# Patient Record
Sex: Female | Born: 1940
Health system: Southern US, Community
[De-identification: ages and names within clinical notes are randomized; demographics above are authoritative.]

## PROBLEM LIST (undated history)

## (undated) DIAGNOSIS — M109 Gout, unspecified: Secondary | ICD-10-CM

## (undated) DIAGNOSIS — D649 Anemia, unspecified: Secondary | ICD-10-CM

## (undated) DIAGNOSIS — Z8679 Personal history of other diseases of the circulatory system: Secondary | ICD-10-CM

## (undated) DIAGNOSIS — F329 Major depressive disorder, single episode, unspecified: Secondary | ICD-10-CM

## (undated) DIAGNOSIS — K449 Diaphragmatic hernia without obstruction or gangrene: Secondary | ICD-10-CM

## (undated) DIAGNOSIS — E039 Hypothyroidism, unspecified: Secondary | ICD-10-CM

## (undated) DIAGNOSIS — I10 Essential (primary) hypertension: Secondary | ICD-10-CM

## (undated) DIAGNOSIS — G971 Other reaction to spinal and lumbar puncture: Secondary | ICD-10-CM

## (undated) DIAGNOSIS — M1990 Unspecified osteoarthritis, unspecified site: Secondary | ICD-10-CM

## (undated) DIAGNOSIS — F419 Anxiety disorder, unspecified: Secondary | ICD-10-CM

## (undated) DIAGNOSIS — M47812 Spondylosis without myelopathy or radiculopathy, cervical region: Secondary | ICD-10-CM

## (undated) DIAGNOSIS — C73 Malignant neoplasm of thyroid gland: Secondary | ICD-10-CM

## (undated) DIAGNOSIS — R011 Cardiac murmur, unspecified: Secondary | ICD-10-CM

## (undated) HISTORY — PX: DILATION AND CURETTAGE OF UTERUS: SHX78

## (undated) HISTORY — DX: Anemia, unspecified: D64.9

## (undated) HISTORY — DX: Essential (primary) hypertension: I10

## (undated) HISTORY — PX: JOINT REPLACEMENT: SHX530

## (undated) HISTORY — DX: Major depressive disorder, single episode, unspecified: F32.9

## (undated) HISTORY — PX: TONSILLECTOMY: SUR1361

## (undated) HISTORY — DX: Spondylosis without myelopathy or radiculopathy, cervical region: M47.812

## (undated) HISTORY — DX: Diaphragmatic hernia without obstruction or gangrene: K44.9

## (undated) HISTORY — DX: Personal history of other diseases of the circulatory system: Z86.79

## (undated) HISTORY — PX: ABDOMINAL HYSTERECTOMY: SHX81

---
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
## 2003-02-21 ENCOUNTER — Ambulatory Visit (HOSPITAL_COMMUNITY): Admission: RE | Admit: 2003-02-21 | Discharge: 2003-02-21 | Payer: Self-pay | Admitting: Cardiology

## 2003-03-08 ENCOUNTER — Ambulatory Visit (HOSPITAL_COMMUNITY): Admission: RE | Admit: 2003-03-08 | Discharge: 2003-03-08 | Payer: Self-pay | Admitting: Cardiology

## 2003-03-08 HISTORY — PX: CARDIAC CATHETERIZATION: SHX172

## 2007-01-27 ENCOUNTER — Ambulatory Visit (HOSPITAL_COMMUNITY): Admission: RE | Admit: 2007-01-27 | Discharge: 2007-01-27 | Payer: Self-pay | Admitting: Cardiology

## 2007-02-07 ENCOUNTER — Ambulatory Visit (HOSPITAL_COMMUNITY): Admission: RE | Admit: 2007-02-07 | Discharge: 2007-02-07 | Payer: Self-pay | Admitting: Cardiology

## 2007-02-24 ENCOUNTER — Other Ambulatory Visit: Admission: RE | Admit: 2007-02-24 | Discharge: 2007-02-24 | Payer: Self-pay | Admitting: Interventional Radiology

## 2007-02-24 ENCOUNTER — Encounter: Admission: RE | Admit: 2007-02-24 | Discharge: 2007-02-24 | Payer: Self-pay | Admitting: Cardiology

## 2007-02-24 ENCOUNTER — Encounter (INDEPENDENT_AMBULATORY_CARE_PROVIDER_SITE_OTHER): Payer: Self-pay | Admitting: Interventional Radiology

## 2007-03-06 DIAGNOSIS — C73 Malignant neoplasm of thyroid gland: Secondary | ICD-10-CM

## 2007-03-06 HISTORY — PX: TOTAL THYROIDECTOMY: SHX2547

## 2007-03-06 HISTORY — DX: Malignant neoplasm of thyroid gland: C73

## 2007-03-25 ENCOUNTER — Ambulatory Visit (HOSPITAL_COMMUNITY): Admission: RE | Admit: 2007-03-25 | Discharge: 2007-03-26 | Payer: Self-pay | Admitting: General Surgery

## 2007-03-25 ENCOUNTER — Encounter (HOSPITAL_BASED_OUTPATIENT_CLINIC_OR_DEPARTMENT_OTHER): Payer: Self-pay | Admitting: General Surgery

## 2007-06-10 ENCOUNTER — Encounter: Admission: RE | Admit: 2007-06-10 | Discharge: 2007-06-10 | Payer: Self-pay | Admitting: Endocrinology

## 2007-06-17 ENCOUNTER — Encounter: Admission: RE | Admit: 2007-06-17 | Discharge: 2007-06-17 | Payer: Self-pay | Admitting: Endocrinology

## 2007-06-24 ENCOUNTER — Encounter: Admission: RE | Admit: 2007-06-24 | Discharge: 2007-06-24 | Payer: Self-pay | Admitting: Endocrinology

## 2007-11-14 ENCOUNTER — Encounter: Admission: RE | Admit: 2007-11-14 | Discharge: 2007-11-14 | Payer: Self-pay | Admitting: Cardiology

## 2007-11-16 ENCOUNTER — Ambulatory Visit: Payer: Self-pay | Admitting: Pulmonary Disease

## 2007-11-16 DIAGNOSIS — K449 Diaphragmatic hernia without obstruction or gangrene: Secondary | ICD-10-CM | POA: Insufficient documentation

## 2007-11-16 DIAGNOSIS — I059 Rheumatic mitral valve disease, unspecified: Secondary | ICD-10-CM | POA: Insufficient documentation

## 2007-11-16 DIAGNOSIS — Z85850 Personal history of malignant neoplasm of thyroid: Secondary | ICD-10-CM

## 2007-11-16 DIAGNOSIS — R05 Cough: Secondary | ICD-10-CM

## 2009-01-21 ENCOUNTER — Encounter (HOSPITAL_COMMUNITY): Admission: RE | Admit: 2009-01-21 | Discharge: 2009-04-17 | Payer: Self-pay | Admitting: Endocrinology

## 2010-01-26 ENCOUNTER — Encounter: Payer: Self-pay | Admitting: Cardiology

## 2010-02-05 ENCOUNTER — Other Ambulatory Visit (HOSPITAL_COMMUNITY): Payer: Self-pay | Admitting: Endocrinology

## 2010-02-05 DIAGNOSIS — C73 Malignant neoplasm of thyroid gland: Secondary | ICD-10-CM

## 2010-03-10 ENCOUNTER — Ambulatory Visit (HOSPITAL_COMMUNITY)
Admission: RE | Admit: 2010-03-10 | Discharge: 2010-03-10 | Disposition: A | Discharge status: Home / Self Care | Payer: Medicare Other | Source: Ambulatory Visit | Attending: Endocrinology | Admitting: Endocrinology

## 2010-03-10 DIAGNOSIS — C73 Malignant neoplasm of thyroid gland: Secondary | ICD-10-CM | POA: Insufficient documentation

## 2010-03-11 ENCOUNTER — Ambulatory Visit (HOSPITAL_COMMUNITY)
Admission: RE | Admit: 2010-03-11 | Discharge: 2010-03-11 | Disposition: A | Discharge status: Home / Self Care | Payer: Medicare Other | Source: Ambulatory Visit | Attending: Endocrinology | Admitting: Endocrinology

## 2010-03-12 ENCOUNTER — Ambulatory Visit (HOSPITAL_COMMUNITY)
Admission: RE | Admit: 2010-03-12 | Discharge: 2010-03-12 | Disposition: A | Discharge status: Home / Self Care | Payer: Medicare Other | Source: Ambulatory Visit | Attending: Endocrinology | Admitting: Endocrinology

## 2010-03-14 ENCOUNTER — Ambulatory Visit (HOSPITAL_COMMUNITY)
Admission: RE | Admit: 2010-03-14 | Discharge: 2010-03-14 | Disposition: A | Discharge status: Home / Self Care | Payer: Medicare Other | Source: Ambulatory Visit | Attending: Endocrinology | Admitting: Endocrinology

## 2010-03-14 DIAGNOSIS — Z85850 Personal history of malignant neoplasm of thyroid: Secondary | ICD-10-CM | POA: Insufficient documentation

## 2010-03-14 DIAGNOSIS — E0789 Other specified disorders of thyroid: Secondary | ICD-10-CM | POA: Insufficient documentation

## 2010-03-14 MED ORDER — SODIUM IODIDE I 131 CAPSULE
4.0000 | Freq: Once | INTRAVENOUS | Status: AC | PRN
  Administered 2010-03-13: 4 via ORAL

## 2010-03-14 MED ORDER — SODIUM IODIDE I 131 CAPSULE
4.0000 | Freq: Once | INTRAVENOUS | Status: AC | PRN
Start: 2010-03-14 — End: 2010-03-14
  Administered 2010-03-14: 4 via ORAL

## 2010-05-20 NOTE — Op Note (Signed)
NAME:  Joanne Tran, COUSE NO.:  [id number]   MEDICAL RECORD NO.:  [id number]          PATIENT TYPE:  OIB   LOCATION:  2550                         FACILITY:  [facility]   PHYSICIAN:  Leonie Man, M.D.   DATE OF BIRTH:  1940-02-01   DATE OF PROCEDURE:  03/25/2007  DATE OF DISCHARGE:                               OPERATIVE REPORT   PREOPERATIVE DIAGNOSIS:  Papillary carcinoma of the thyroid.   POSTOPERATIVE DIAGNOSIS:  Papillary carcinoma of the thyroid.   OPERATION/PROCEDURE:  Total thyroidectomy.   SURGEON:  Leonie Man, M.D.   ASSISTANT:  Alfonse Ras, M.D.   ANESTHESIA:  General.   INDICATIONS:  Note the patient is a 70 year old patient with a large  thyroid nodule which on fine-needle aspiration biopsy shows a papillary  carcinoma.  The patient comes to the operating room now after risks and  potential benefits of surgery have been fully discussed including the  risk of recurrent laryngeal nerve injury as well as the risk of  permanent hypoparathyroidism were fully discussed.  She understands  these risks and gives her consent to surgery.   PROCEDURE:  The patient is positioned supinely and following induction  of satisfactory general anesthesia, the neck is prepped and draped to be  included in a sterile operative field.  Positive identification of the  patient as Joanne Tran was carried out and the procedure be done  total thyroidectomy.   A transverse collar incision extending to the anterior border of the  sternocleidomastoid on both sides of the neck was carried out  approximately two fingerbreadths above the sternal notch, deepened  through skin and subcutaneous tissues.  Then it was carried  through the  platysma. A superior muocutaneous flap is raised up to the thyroid  cartilage and inferior flap carried down to the sternal notch.  Midline  strap muscles are opened up in the midline and dissection was carried  over towards the right side  dissecting a very large cystic thyroid mass  down into the lateral portion of the right neck.  The thyroid is  elevated and dissection carried up towards the superior pole where the  superior pole vessels are identified and secured with ties of 2-0 silk  and then transected.  The  right thyroid lobe was then dissected free  from the surrounding tissues, being careful to avoid the areas of the  parathyroid glands and the recurrent laryngeal nerve.  The thyroid was  then dissected free, carried over the midline.   I then went to the other side of the table and elevated the strap  muscles on the left and carried them laterally.  We identified the left  thyroid lobe which was quite a bit smaller. Dissection carried up to the  superior pole of this gland and this was secured with 2-0 silk ties and  transected at the upper pole.  Thyroid was then dissected free from the  surrounding tissues. Definite identification of the left upper pole  parathyroid gland and recurrent laryngeal nerves were made.  Dissection  was carried out to remove the entire thyroid from off  of the trachea and  this was forwarded for pathologic evaluation.  There are no palpable  lymph nodes within the neck.  All areas of dissection were then checked  for hemostasis and hemostasis was obtained with electrocautery.  Sponge  and instrument counts were verified.  I placed surgicel pads in either  side of the neck for additional hemostasis.  The midline strap muscles  were then closed with interrupted 3-0 Vicryl sutures.  The platysma  muscle closed with interrupted 3-0 Vicryl and skin closed with running 5-  0 Monocryl suture, reinforced with Steri-Strips.  Sterile dressings  applied.  The anesthetic was reversed and the patient removed from the  operating room to the recovery room in stable condition.  She tolerated  the procedure well.      Leonie Man, M.D.  Electronically Signed     PB/MEDQ  D:  03/25/2007  T:   03/25/2007  Job:  [id number]   cc:   Osvaldo Shipper. Spruill, M.D.

## 2010-05-23 NOTE — Cardiovascular Report (Signed)
NAME:  Joanne Tran, Joanne Tran                        ACCOUNT NO.:  [id number]   MEDICAL RECORD NO.:  [id number]                   PATIENT TYPE:  OIB   LOCATION:  2899                                 FACILITY:  [facility]   PHYSICIAN:  Eduardo Osier. Sharyn Lull, M.D.              DATE OF BIRTH:  05-06-1940   DATE OF PROCEDURE:  03/08/2003  DATE OF DISCHARGE:  03/08/2003                              CARDIAC CATHETERIZATION   PROCEDURES PERFORMED:  1. Left cardiac catheterization.  2. Selective left and right coronary angiography.  3. Left ventriculography via right groin using Judkins technique.   CARDIOLOGISTEduardo Osier Sharyn Lull, M.D.   INDICATIONS FOR THE PROCEDURE:  Ms. Drenda is a 70 year old white female  with a past medical history significant for hypertension, heart murmur,  depression, and positive family history of heart disease.  The patient has  complaints of retrosternal squeezing chest pain associated with feeling weak  and tired since Christmas off and on; and lately, for the last two weeks,  recurrent chest pain associated with feeling weak and tired.  The patient  also gives a history of exertional dyspnea.  Denies PND, orthopnea and leg  swelling.  Denies palpitations, lightheadedness and syncope.   The patient had a Persantine Cardiolite on February 21, 2003, which showed a  focal area of ischemia in the mid anterior wall with an EF of 76%.  The  patient was referred for a left cath, and possible PTCA and stenting.   PAST MEDICAL HISTORY:  Past medical history is as above.   PAST SURGICAL HISTORY:  The patient had a hysterectomy 30+ years ago and  prior to the hysterectomy she had multiple D&Cs.  She had an appendectomy at  the age of 7.   ALLERGIES:  The patient is allergic to CODEINE.   MEDICATIONS:  Medications at home:  1. Tenormin 50 mg p.o. daily.  2. Hydrochlorothiazide 25 mg p.o. daily.  3. Prozac 20 mg p.o. daily.  4. Baby aspirin 81 mg p.o. daily.   SOCIAL HISTORY:   The patient is married and has one child.  No history of  smoking or alcohol abuse.  She works with mentally challenged adults.   FAMILY HISTORY:  Father died of an MI at the age of 72. Mother died of an MI  at the age of 96.  Uncles on father's side had coronary artery disease and  coronary artery bypass grafting.  One sister in good health.   PHYSICAL EXAMINATION:  GENERAL APPEARANCE:  On examination she is alert,  awake and oriented times three and in no acute distress.  VITAL SIGNS:  Blood pressure is 130/70 and pulse is 70 and reg.  HEENT:  Conjunctivae are pink.  NECK:  Neck supple.  No JVD.  No bruits.  LUNGS:  Lungs are clear to auscultation without rhonchi or rales.  CARDIOVASCULAR EXAMINATION:  S1 and S2 are normal.  There is no  S3 gallop or  murmur.  ABDOMEN:  Abdomen is soft.  Bowel sounds are present.  Nontender.  EXTREMITIES:  There is no clubbing, cyanosis or edema.   IMPRESSION:  1. New onset angina with recent exacerbation.  2. Positive Persantine Cardiolite.  3. Hypertension.  4. Depression.  5. Positive family history of coronary artery disease.   Discussed with the patient regarding left cath, possible PTCA and stenting;  and its risks, i.e. death, MI, stroke, need for emergency CABG, risks of  restenosis, local vascular complications, etc; and, she consented for PCI.   DESCRIPTION OF PROCEDURE:  After obtaining the informed consent the patient  was brought to the cath lab and was placed on the fluoroscopic table.  The  right groin was prepped and draped in the usual fashion.  Two percent  Xylocaine was used for local anesthesia in the right groin.  With the help  of a thin-walled needle a 6 French arterial sheath was placed.  The sheath  was aspirated and flushed.  Next, a 6 French left Judkins catheter was  advanced over the wire under fluoroscopic guidance up to the ascending  aorta.  The wire was pulled out, the catheter was aspirated and connected to  the  manifold.  Catheter was further advanced and engaged into the left  coronary ostium.  Multiple views of the left system were taken.  Next, the  catheter was disengaged, was pulled out over the wire and was replaced with  a 6 French right Judkins catheter, which was advanced over the wire under  fluoroscopic guidance up to the ascending aorta.  The wire was pulled out,  the catheter was aspirated and connected to the manifold.  Catheter was  further advanced and engaged into the right coronary ostium.  Multiple views  of the right system were taken.  Next, the catheter was disengaged, was  pulled out over the wire and was replaced with a 6 French pigtail catheter,  which was advanced over the wire under fluoroscopic guidance up to the  ascending aorta.  the wire was pulled out, the catheter was aspirated and  connected to the manifold.  The catheter was further advanced across the  aortic valve and into the LV.  LV pressures were recorded.  Next, left  ventriculography was done in the 30-degree RAO position.  Post angiographic  pressures were recorded from the LV and then pullback pressures were  recorded from the aorta.  There was no gradient across the aortic valve.   Next, the pigtail catheter was pulled over the wire, sheaths were aspirated  and flushed.   FINDINGS:  LV:  The LV showed good left ventricular systolic function with  an EF of 60-65%.   Left Main:  The left main was patent.   LAD:  The LAD was patent in the proximal and midportion, and distally had 20-  30% diffuse disease.  The diagonal-1 to the diagonal-3 were very small,  which were patent.   Left Circumflex:  The  left circumflex was patent.  The OM-1 was very small,  which arises close to the left main, which was patent.  The OM-2 was large,  which was patent.   RCA:  The RCA was patent.   The patient tolerated the procedure well.  There were no complications.  The patient was transferred to th recovery room  in stable condition.  Eduardo Osier. Sharyn Lull, M.D.    MNH/MEDQ  D:  03/08/2003  T:  03/09/2003  Job:  [id number]   cc:   Valetta Mole. Swords, M.D. [facility]   Catheterization Laboratory

## 2010-09-29 LAB — COMPREHENSIVE METABOLIC PANEL
AST: 26
Albumin: 3.7
Calcium: 8.9
Creatinine, Ser: 0.84
GFR calc Af Amer: >60

## 2010-09-29 LAB — APTT: aPTT: 29

## 2010-09-29 LAB — CBC
MCHC: 33.2
MCV: 84.6
Platelets: 382
RDW: 14.1
WBC: 13 — ABNORMAL HIGH

## 2010-09-29 LAB — PROTIME-INR: Prothrombin Time: 12.6

## 2010-09-29 LAB — CALCIUM
Calcium: 8.2 — ABNORMAL LOW
Calcium: 8.2 — ABNORMAL LOW

## 2010-09-29 LAB — DIFFERENTIAL
Eosinophils Relative: 1
Lymphocytes Relative: 25
Lymphs Abs: 3.2
Monocytes Absolute: 0.4
Neutro Abs: 9.2 — ABNORMAL HIGH

## 2012-12-21 ENCOUNTER — Other Ambulatory Visit: Payer: Self-pay | Admitting: Pulmonary Disease

## 2013-01-16 DIAGNOSIS — M1990 Unspecified osteoarthritis, unspecified site: Secondary | ICD-10-CM | POA: Diagnosis not present

## 2013-01-24 ENCOUNTER — Other Ambulatory Visit: Payer: Self-pay | Admitting: Pulmonary Disease

## 2013-02-09 DIAGNOSIS — E890 Postprocedural hypothyroidism: Secondary | ICD-10-CM | POA: Diagnosis not present

## 2013-02-16 DIAGNOSIS — E890 Postprocedural hypothyroidism: Secondary | ICD-10-CM | POA: Diagnosis not present

## 2013-02-16 DIAGNOSIS — C73 Malignant neoplasm of thyroid gland: Secondary | ICD-10-CM | POA: Diagnosis not present

## 2013-03-01 ENCOUNTER — Other Ambulatory Visit: Payer: Self-pay | Admitting: Pulmonary Disease

## 2013-04-06 ENCOUNTER — Other Ambulatory Visit: Payer: Self-pay | Admitting: Pulmonary Disease

## 2013-05-08 DIAGNOSIS — M1990 Unspecified osteoarthritis, unspecified site: Secondary | ICD-10-CM | POA: Diagnosis not present

## 2013-05-11 ENCOUNTER — Other Ambulatory Visit: Payer: Self-pay | Admitting: Pulmonary Disease

## 2013-09-18 DIAGNOSIS — F411 Generalized anxiety disorder: Secondary | ICD-10-CM | POA: Diagnosis not present

## 2013-10-16 DIAGNOSIS — Z1231 Encounter for screening mammogram for malignant neoplasm of breast: Secondary | ICD-10-CM | POA: Diagnosis not present

## 2013-10-16 DIAGNOSIS — Z01419 Encounter for gynecological examination (general) (routine) without abnormal findings: Secondary | ICD-10-CM | POA: Diagnosis not present

## 2013-10-16 DIAGNOSIS — N951 Menopausal and female climacteric states: Secondary | ICD-10-CM | POA: Diagnosis not present

## 2013-11-29 DIAGNOSIS — M170 Bilateral primary osteoarthritis of knee: Secondary | ICD-10-CM | POA: Diagnosis not present

## 2013-11-29 DIAGNOSIS — M1711 Unilateral primary osteoarthritis, right knee: Secondary | ICD-10-CM | POA: Diagnosis not present

## 2013-12-13 DIAGNOSIS — M1712 Unilateral primary osteoarthritis, left knee: Secondary | ICD-10-CM | POA: Diagnosis not present

## 2013-12-18 DIAGNOSIS — E669 Obesity, unspecified: Secondary | ICD-10-CM | POA: Diagnosis not present

## 2013-12-18 DIAGNOSIS — F419 Anxiety disorder, unspecified: Secondary | ICD-10-CM | POA: Diagnosis not present

## 2014-01-01 DIAGNOSIS — E785 Hyperlipidemia, unspecified: Secondary | ICD-10-CM | POA: Diagnosis not present

## 2014-01-01 DIAGNOSIS — I10 Essential (primary) hypertension: Secondary | ICD-10-CM | POA: Diagnosis not present

## 2014-02-15 DIAGNOSIS — C73 Malignant neoplasm of thyroid gland: Secondary | ICD-10-CM | POA: Diagnosis not present

## 2014-02-15 DIAGNOSIS — E890 Postprocedural hypothyroidism: Secondary | ICD-10-CM | POA: Diagnosis not present

## 2014-02-22 DIAGNOSIS — E890 Postprocedural hypothyroidism: Secondary | ICD-10-CM | POA: Diagnosis not present

## 2014-02-22 DIAGNOSIS — C73 Malignant neoplasm of thyroid gland: Secondary | ICD-10-CM | POA: Diagnosis not present

## 2014-03-07 DIAGNOSIS — E669 Obesity, unspecified: Secondary | ICD-10-CM | POA: Diagnosis not present

## 2014-03-07 DIAGNOSIS — F419 Anxiety disorder, unspecified: Secondary | ICD-10-CM | POA: Diagnosis not present

## 2014-05-24 DIAGNOSIS — E890 Postprocedural hypothyroidism: Secondary | ICD-10-CM | POA: Diagnosis not present

## 2014-05-24 DIAGNOSIS — C73 Malignant neoplasm of thyroid gland: Secondary | ICD-10-CM | POA: Diagnosis not present

## 2014-06-06 DIAGNOSIS — M179 Osteoarthritis of knee, unspecified: Secondary | ICD-10-CM | POA: Diagnosis not present

## 2014-08-07 DIAGNOSIS — E669 Obesity, unspecified: Secondary | ICD-10-CM | POA: Diagnosis not present

## 2014-08-07 DIAGNOSIS — F419 Anxiety disorder, unspecified: Secondary | ICD-10-CM | POA: Diagnosis not present

## 2014-10-04 DIAGNOSIS — M1712 Unilateral primary osteoarthritis, left knee: Secondary | ICD-10-CM | POA: Diagnosis not present

## 2014-10-25 DIAGNOSIS — Z1231 Encounter for screening mammogram for malignant neoplasm of breast: Secondary | ICD-10-CM | POA: Diagnosis not present

## 2014-10-25 DIAGNOSIS — Z6833 Body mass index (BMI) 33.0-33.9, adult: Secondary | ICD-10-CM | POA: Diagnosis not present

## 2014-10-25 DIAGNOSIS — Z01419 Encounter for gynecological examination (general) (routine) without abnormal findings: Secondary | ICD-10-CM | POA: Diagnosis not present

## 2014-12-12 DIAGNOSIS — M1711 Unilateral primary osteoarthritis, right knee: Secondary | ICD-10-CM | POA: Diagnosis not present

## 2014-12-17 DIAGNOSIS — E669 Obesity, unspecified: Secondary | ICD-10-CM | POA: Diagnosis not present

## 2014-12-17 DIAGNOSIS — F419 Anxiety disorder, unspecified: Secondary | ICD-10-CM | POA: Diagnosis not present

## 2014-12-18 ENCOUNTER — Encounter: Payer: Self-pay | Admitting: *Deleted

## 2014-12-21 ENCOUNTER — Ambulatory Visit (INDEPENDENT_AMBULATORY_CARE_PROVIDER_SITE_OTHER): Payer: Medicare Other | Admitting: Cardiology

## 2014-12-21 ENCOUNTER — Telehealth: Payer: Self-pay | Admitting: Cardiology

## 2014-12-21 ENCOUNTER — Encounter: Payer: Self-pay | Admitting: *Deleted

## 2014-12-21 ENCOUNTER — Encounter: Payer: Self-pay | Admitting: Cardiology

## 2014-12-21 VITALS — BP 122/72 | HR 63 | Ht 65.0 in | Wt 202.0 lb

## 2014-12-21 DIAGNOSIS — E7800 Pure hypercholesterolemia, unspecified: Secondary | ICD-10-CM | POA: Diagnosis not present

## 2014-12-21 DIAGNOSIS — E039 Hypothyroidism, unspecified: Secondary | ICD-10-CM | POA: Diagnosis not present

## 2014-12-21 DIAGNOSIS — I25709 Atherosclerosis of coronary artery bypass graft(s), unspecified, with unspecified angina pectoris: Secondary | ICD-10-CM

## 2014-12-21 DIAGNOSIS — I10 Essential (primary) hypertension: Secondary | ICD-10-CM | POA: Diagnosis not present

## 2014-12-21 DIAGNOSIS — R072 Precordial pain: Secondary | ICD-10-CM

## 2014-12-21 DIAGNOSIS — E876 Hypokalemia: Secondary | ICD-10-CM | POA: Diagnosis not present

## 2014-12-21 DIAGNOSIS — Z8679 Personal history of other diseases of the circulatory system: Secondary | ICD-10-CM

## 2014-12-21 NOTE — Progress Notes (Signed)
Cardiology Office Note  Date: 12/21/2014   ID: [name], DOB [birth year], MRN [id number]  PCP: Patricia Nettle, MD  Consulting Cardiologist: Rozann Lesches, MD   Chief Complaint  Patient presents with  . Chest Pain    History of Present Illness: Joanne Tran is a 74 y.o. female referred for cardiology consultation by Dr. Montez Morita with request for stress testing. I reviewed her history. She states that over the last week she has been experiencing a feeling of chest tightness and bilateral upper arm discomfort, usually before she goes to bed at nighttime. This lasts for several minutes. Otherwise, she has not had an exertional onset of symptoms, but states that she has dyspnea on exertion that has been present for several months. So far she has not had any recurrent symptoms this week.  Records indicate previous cardiac catheterization by Dr. Terrence Dupont back in March 2005 following an abnormal Persantine Cardiolite, which demonstrated only 20-30% LAD stenosis distally. She does not recall the symptoms that she was experiencing at that time.  I reviewed her recent ECG, outlined below. She does have lab work obtained by Dr. Montez Morita, results pending.  We discussed her medications, she has had no major changes overall. Blood pressure and heart rate look good today.  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  Past Medical History  Diagnosis Date  . Major depression ([place])   . History of thyroid cancer March 2009  . Cervical spondylosis   . Hiatal hernia   . History of mitral valve prolapse     Past Surgical History  Procedure Laterality Date  . Total thyroidectomy      Radioactive iodine therapy Dr. Chalmers Cater  . Cardiac catheterization  03/08/2003    Dr. Terrence Dupont  . Abdominal hysterectomy    . Dilation and curettage of uterus    . Appendectomy      age 32    Current Outpatient Prescriptions  Medication Sig Dispense Refill  . aspirin EC 81 MG  tablet Take 81 mg by mouth daily.    Marland Kitchen atenolol (TENORMIN) 50 MG tablet Take 50 mg by mouth daily.    Marland Kitchen FLUoxetine (PROZAC) 20 MG capsule Take 20 mg by mouth daily.    . hydrochlorothiazide (HYDRODIURIL) 25 MG tablet Take 25 mg by mouth daily.    Marland Kitchen levothyroxine (SYNTHROID, LEVOTHROID) 88 MCG tablet Take 88 mcg by mouth daily before breakfast.    . lisinopril (PRINIVIL,ZESTRIL) 40 MG tablet Take 40 mg by mouth daily.     No current facility-administered medications for this visit.   Allergies:  Codeine   Social History: The patient  reports that she has never smoked. She has never used smokeless tobacco. She reports that she does not drink alcohol.   Family History: The patient's family history includes Heart attack in her father and mother; Heart disease in her father, mother, and paternal uncle; Ovarian cancer in her sister.   ROS:  Please see the history of present illness. Otherwise, complete review of systems is positive for back pain.  All other systems are reviewed and negative.   Physical Exam: VS:  BP 122/72 mmHg  Pulse 63  Ht 5\' 5"  (1.651 m)  Wt 202 lb (91.627 kg)  BMI 33.61 kg/m2  SpO2 97%, BMI Body mass index is 33.61 kg/(m^2).  Wt Readings from Last 3 Encounters:  12/21/14 202 lb (91.627 kg)  11/16/07 217 lb (98.431 kg)    General: Overweight woman,  appears comfortable at rest. HEENT: Conjunctiva and lids normal, oropharynx clear. Neck: Supple, no elevated JVP or carotid bruits, no thyromegaly. Lungs: Clear to auscultation, nonlabored breathing at rest. Cardiac: Regular rate and rhythm, no S3 or significant systolic murmur, no pericardial rub. Abdomen: Soft, nontender, bowel sounds present, no guarding or rebound. Extremities: No pitting edema, distal pulses 2+. Skin: Warm and dry. Musculoskeletal: No kyphosis. Neuropsychiatric: Alert and oriented x3, affect grossly appropriate.  ECG: tracing from 12/17/2014 showed sinus bradycardia with early repolarization and  nonspecific T-wave changes.  Assessment and Plan:  1. Recent recurring precordial tightness with bilateral arm discomfort mainly in the evenings before bedtime. There has not been a definite exertional component but she does have dyspnea on exertion. So far she has not had recurring symptoms this week. Her history does include mild atherosclerosis within the LAD back in 2005, family history of premature CAD in her mother and father, elevated blood pressure, and history of mitral valve prolapse. ECG is nonspecific. We discussed options for follow-up evaluation including both noninvasive and invasive techniques. For now she was comfortable with stress testing and we will arrange a Lexiscan Cardiolite on medical therapy. I did ask her to seek medical attention if her symptoms escalate suddenly in the meanwhile.  2. History of hypertension, she is on atenolol, HCTZ and lisinopril. Blood pressure is normal today.  3. Hypothyroidism, on Synthroid. She just recently had lab work obtained, results pending.  Current medicines were reviewed with the patient today.   Orders Placed This Encounter  Procedures  . NM Myocar Multi W/Spect W/Wall Motion / EF  . Myocardial Perfusion Imaging    Disposition: Call with results.   Signed, Satira Sark, MD, [facility] 12/21/2014 2:43 PM    [place] [facility] [place], [place],  [address] Phone: [phone]; Fax: [phone]

## 2014-12-21 NOTE — Telephone Encounter (Signed)
Lexiscan CArdiolite on medications (soon appt) dx: precordial pain & CAD w/unspecified angina Scheduled Dec 26th arrival at 10:15am

## 2014-12-21 NOTE — Patient Instructions (Signed)
Your physician recommends that you continue on your current medications as directed. Please refer to the Current Medication list given to you today. Your physician has requested that you have a lexiscan myoview. For further information please visit [email or web address]. Please follow instruction sheet, as given. We will call you with your results. 

## 2014-12-25 NOTE — Telephone Encounter (Signed)
No precert required 

## 2014-12-31 ENCOUNTER — Encounter (HOSPITAL_COMMUNITY): Payer: PRIVATE HEALTH INSURANCE

## 2015-01-02 ENCOUNTER — Encounter (HOSPITAL_COMMUNITY): Payer: PRIVATE HEALTH INSURANCE

## 2015-01-04 ENCOUNTER — Encounter (HOSPITAL_COMMUNITY)
Admission: RE | Admit: 2015-01-04 | Discharge: 2015-01-04 | Disposition: A | Discharge status: Home / Self Care | Payer: Medicare Other | Source: Ambulatory Visit | Attending: Cardiology | Admitting: Cardiology

## 2015-01-04 ENCOUNTER — Encounter (HOSPITAL_COMMUNITY): Payer: Self-pay

## 2015-01-04 ENCOUNTER — Telehealth: Payer: Self-pay | Admitting: *Deleted

## 2015-01-04 ENCOUNTER — Inpatient Hospital Stay (HOSPITAL_COMMUNITY): Admission: RE | Admit: 2015-01-04 | Payer: PRIVATE HEALTH INSURANCE | Source: Ambulatory Visit

## 2015-01-04 DIAGNOSIS — R072 Precordial pain: Secondary | ICD-10-CM

## 2015-01-04 DIAGNOSIS — I25709 Atherosclerosis of coronary artery bypass graft(s), unspecified, with unspecified angina pectoris: Secondary | ICD-10-CM | POA: Insufficient documentation

## 2015-01-04 LAB — NM MYOCAR MULTI W/SPECT W/WALL MOTION / EF
CHL CUP NUCLEAR SRS: 4
CHL CUP NUCLEAR SSS: 10
CSEPPHR: 76 {beats}/min
LHR: 0
LV dias vol: 60 mL
LV sys vol: 18 mL
Rest HR: 53 {beats}/min
SDS: 6
TID: 1.2

## 2015-01-04 MED ORDER — REGADENOSON 0.4 MG/5ML IV SOLN
INTRAVENOUS | Status: AC
  Administered 2015-01-04: 0.4 mg via INTRAVENOUS
  Filled 2015-01-04: qty 5

## 2015-01-04 MED ORDER — SODIUM CHLORIDE 0.9 % IJ SOLN
INTRAMUSCULAR | Status: AC
  Administered 2015-01-04: 10 mL via INTRAVENOUS
  Filled 2015-01-04: qty 3

## 2015-01-04 MED ORDER — TECHNETIUM TC 99M SESTAMIBI - CARDIOLITE
10.0000 | Freq: Once | INTRAVENOUS | Status: AC | PRN
  Administered 2015-01-04: 10 via INTRAVENOUS

## 2015-01-04 MED ORDER — TECHNETIUM TC 99M SESTAMIBI GENERIC - CARDIOLITE
30.0000 | Freq: Once | INTRAVENOUS | Status: AC | PRN
  Administered 2015-01-04: 29 via INTRAVENOUS

## 2015-01-04 NOTE — Telephone Encounter (Signed)
Pt aware, routed to pcp 

## 2015-01-04 NOTE — Telephone Encounter (Signed)
-----   Message from Satira Sark, MD sent at 01/04/2015  3:50 PM EST ----- Reviewed report. Please let her know that the stress test was normal, overall low risk. No further cardiac testing planned unless her symptoms worsen. Keep follow-up with Dr. Montez Morita.

## 2015-02-18 DIAGNOSIS — C73 Malignant neoplasm of thyroid gland: Secondary | ICD-10-CM | POA: Diagnosis not present

## 2015-02-18 DIAGNOSIS — E890 Postprocedural hypothyroidism: Secondary | ICD-10-CM | POA: Diagnosis not present

## 2015-02-25 DIAGNOSIS — E890 Postprocedural hypothyroidism: Secondary | ICD-10-CM | POA: Diagnosis not present

## 2015-02-25 DIAGNOSIS — C73 Malignant neoplasm of thyroid gland: Secondary | ICD-10-CM | POA: Diagnosis not present

## 2015-03-18 DIAGNOSIS — E669 Obesity, unspecified: Secondary | ICD-10-CM | POA: Diagnosis not present

## 2015-03-18 DIAGNOSIS — N289 Disorder of kidney and ureter, unspecified: Secondary | ICD-10-CM | POA: Diagnosis not present

## 2015-04-01 DIAGNOSIS — N289 Disorder of kidney and ureter, unspecified: Secondary | ICD-10-CM | POA: Diagnosis not present

## 2015-04-01 DIAGNOSIS — I10 Essential (primary) hypertension: Secondary | ICD-10-CM | POA: Diagnosis not present

## 2015-04-01 DIAGNOSIS — E785 Hyperlipidemia, unspecified: Secondary | ICD-10-CM | POA: Diagnosis not present

## 2015-04-10 DIAGNOSIS — M1712 Unilateral primary osteoarthritis, left knee: Secondary | ICD-10-CM | POA: Diagnosis not present

## 2015-05-29 DIAGNOSIS — M1712 Unilateral primary osteoarthritis, left knee: Secondary | ICD-10-CM | POA: Diagnosis not present

## 2015-05-30 ENCOUNTER — Encounter (HOSPITAL_COMMUNITY)
Admission: RE | Admit: 2015-05-30 | Discharge: 2015-05-30 | Disposition: A | Discharge status: Home / Self Care | Payer: Medicare Other | Source: Ambulatory Visit | Attending: Orthopaedic Surgery | Admitting: Orthopaedic Surgery

## 2015-05-30 ENCOUNTER — Encounter (HOSPITAL_COMMUNITY)
Admission: RE | Admit: 2015-05-30 | Discharge: 2015-05-30 | Disposition: A | Discharge status: Home / Self Care | Payer: Medicare Other | Source: Ambulatory Visit | Attending: Orthopedic Surgery | Admitting: Orthopedic Surgery

## 2015-05-30 ENCOUNTER — Other Ambulatory Visit (HOSPITAL_COMMUNITY): Payer: Self-pay | Admitting: *Deleted

## 2015-05-30 ENCOUNTER — Encounter (HOSPITAL_COMMUNITY): Payer: Self-pay

## 2015-05-30 DIAGNOSIS — I10 Essential (primary) hypertension: Secondary | ICD-10-CM | POA: Diagnosis not present

## 2015-05-30 DIAGNOSIS — M1712 Unilateral primary osteoarthritis, left knee: Secondary | ICD-10-CM | POA: Insufficient documentation

## 2015-05-30 DIAGNOSIS — Z79899 Other long term (current) drug therapy: Secondary | ICD-10-CM | POA: Diagnosis not present

## 2015-05-30 DIAGNOSIS — Z01812 Encounter for preprocedural laboratory examination: Secondary | ICD-10-CM | POA: Diagnosis not present

## 2015-05-30 DIAGNOSIS — Z85850 Personal history of malignant neoplasm of thyroid: Secondary | ICD-10-CM | POA: Insufficient documentation

## 2015-05-30 DIAGNOSIS — I341 Nonrheumatic mitral (valve) prolapse: Secondary | ICD-10-CM | POA: Diagnosis not present

## 2015-05-30 DIAGNOSIS — Z7982 Long term (current) use of aspirin: Secondary | ICD-10-CM | POA: Diagnosis not present

## 2015-05-30 DIAGNOSIS — Z0183 Encounter for blood typing: Secondary | ICD-10-CM | POA: Diagnosis not present

## 2015-05-30 DIAGNOSIS — Z01818 Encounter for other preprocedural examination: Secondary | ICD-10-CM | POA: Diagnosis not present

## 2015-05-30 HISTORY — DX: Anxiety disorder, unspecified: F41.9

## 2015-05-30 HISTORY — DX: Other reaction to spinal and lumbar puncture: G97.1

## 2015-05-30 HISTORY — DX: Cardiac murmur, unspecified: R01.1

## 2015-05-30 LAB — COMPREHENSIVE METABOLIC PANEL
ALBUMIN: 3.3 g/dL — AB (ref 3.5–5.0)
ALK PHOS: 69 U/L (ref 38–126)
ALT: 14 U/L (ref 14–54)
AST: 21 U/L (ref 15–41)
Anion gap: 8 (ref 5–15)
BILIRUBIN TOTAL: 0.6 mg/dL (ref 0.3–1.2)
BUN: 16 mg/dL (ref 6–20)
CO2: 23 mmol/L (ref 22–32)
CREATININE: 1.02 mg/dL — AB (ref 0.44–1.00)
Calcium: 9 mg/dL (ref 8.9–10.3)
Chloride: 109 mmol/L (ref 101–111)
GFR calc Af Amer: >60 mL/min (ref >60)
GFR, EST NON AFRICAN AMERICAN: 54 mL/min — AB (ref >60)
GLUCOSE: 105 mg/dL — AB (ref 65–99)
POTASSIUM: 3.8 mmol/L (ref 3.5–5.1)
Sodium: 140 mmol/L (ref 135–145)
TOTAL PROTEIN: 6.5 g/dL (ref 6.5–8.1)

## 2015-05-30 LAB — URINALYSIS, ROUTINE W REFLEX MICROSCOPIC
Bilirubin Urine: NEGATIVE
GLUCOSE, UA: NEGATIVE mg/dL
Ketones, ur: NEGATIVE mg/dL
Nitrite: NEGATIVE
Protein, ur: NEGATIVE mg/dL
SPECIFIC GRAVITY, URINE: 1.026 (ref 1.005–1.030)
pH: 6 (ref 5.0–8.0)

## 2015-05-30 LAB — APTT: APTT: 27 s (ref 24–37)

## 2015-05-30 LAB — SURGICAL PCR SCREEN
MRSA, PCR: NEGATIVE
Staphylococcus aureus: POSITIVE — AB

## 2015-05-30 LAB — CBC
HEMATOCRIT: 42.6 % (ref 36.0–46.0)
HEMOGLOBIN: 13.3 g/dL (ref 12.0–15.0)
MCH: 27.5 pg (ref 26.0–34.0)
MCHC: 31.2 g/dL (ref 30.0–36.0)
MCV: 88 fL (ref 78.0–100.0)
Platelets: 281 10*3/uL (ref 150–400)
RBC: 4.84 MIL/uL (ref 3.87–5.11)
RDW: 13.6 % (ref 11.5–15.5)
WBC: 9.7 10*3/uL (ref 4.0–10.5)

## 2015-05-30 LAB — TYPE AND SCREEN
ABO/RH(D): O POS
ANTIBODY SCREEN: NEGATIVE

## 2015-05-30 LAB — PROTIME-INR
INR: 1.01 (ref 0.00–1.49)
Prothrombin Time: 13.5 seconds (ref 11.6–15.2)

## 2015-05-30 LAB — ABO/RH: ABO/RH(D): O POS

## 2015-05-30 LAB — URINE MICROSCOPIC-ADD ON

## 2015-05-30 NOTE — Progress Notes (Signed)
Cardiologist Dr Domenic Polite with [facility].  Last seen in December 2016   States had stress test due to some chest pain but it was normal.  Has not had any chest pain recently. States had echo cardiogram more than 5 years ago with Dr Domenic Polite but I am not able to find in [facility] Heart Cath report from 2005 in EPIC media EKG in EPIC  PCP Dr Theresia Majors

## 2015-05-30 NOTE — Pre-Procedure Instructions (Signed)
    Joanne Tran  05/30/2015      [facility] - [place], [address] [place] [address] Phone: [phone] Fax: [phone]    Your procedure is scheduled on Tuesday June 6th  Report to [facility] Admitting at 5:30 am  Call this number if you have problems the morning of surgery: [phone]  If questions prior to surgery date you may call [phone] M-F between 8 and 4   Remember:  Do not eat food or drink liquids after midnight.  Take these medicines the morning of surgery with A SIP OF WATER: Atenolol (tenormin), Prozac, Synthroid (levothyroxine) Stop taking aspirin and aspirin containing products, Nsaids (such as ibuproven, aleve etc), vitamins and herbals supplements 7 days prior to surgery.   Do not wear jewelry, make-up or nail polish.  Do not wear lotions, powders, or perfumes.  You may wear deodorant.  Do not shave 48 hours prior to surgery.    Do not bring valuables to the hospital.  [facility] is not responsible for any belongings or valuables.  Contacts, dentures or bridgework may not be worn into surgery.  Leave your suitcase in the car.  After surgery it may be brought to your room.  For patients admitted to the hospital, discharge time will be determined by your treatment team.  Special instructions: Shower with CHG the night before and morning of surgery as instructed  Please read over the following fact sheets that you were given. Blood Transfusion Information and MRSA Information

## 2015-05-30 NOTE — Progress Notes (Signed)
Call placed to Dr Rudene Anda office informing them of U/A results. (Culture is still in process)

## 2015-05-30 NOTE — Progress Notes (Addendum)
Anesthesia Chart Review:  Pt is a 75 year old female scheduled for L total knee arthroplasty on 06/11/2015 with Dr. Durward Fortes.   PMH includes:  HTN, MVP, thyroid cancer. Anesthesia history includes spinal headache 25 years ago. Never smoker. BMI 33.5  Medications include: ASA, atenolol, hctz, levothyroxine, lisinopril.  Preoperative labs reviewed.  UA consistent with UTI, urine culture pending.   Chest x-ray 05/30/15 reviewed. No acute abnormality.Stable large hiatal hernia.  EKG 12/17/14: sinus bradycardia (52 bpm), lateral ST elevation suggests early repolarization, septal T wave changes are nonspecific.   Nuclear stress test 01/04/15:  There was no ST segment deviation noted during stress.  The study is normal.  This is a low risk study.  Nuclear stress EF: 70%.  Cardiac cath 03/08/03:  1. Nonobstructive CAD (dLAD 20-30%) 2. LV systolic function normal, EF 60-65%  If no changes, I anticipate pt can proceed with surgery as scheduled.   Willeen Cass, FNP-BC [facility]/Anesthesiology Phone: [phone] 05/30/2015 3:04 PM  Addendum:   Urine culture grew multiple species, recollection recommended. I notified Kim in Dr. Rudene Anda office.  Willeen Cass, FNP-BC [facility]/Anesthesiology Phone: [phone] 05/31/2015 4:29 PM

## 2015-05-30 NOTE — Progress Notes (Signed)
Patient called and informed of positive PCR and to start her mupirocin as instructed with stated understanding. [facility] pharmacy called with prescription

## 2015-05-31 LAB — URINE CULTURE

## 2015-06-01 DIAGNOSIS — Z85850 Personal history of malignant neoplasm of thyroid: Secondary | ICD-10-CM | POA: Diagnosis not present

## 2015-06-01 DIAGNOSIS — F329 Major depressive disorder, single episode, unspecified: Secondary | ICD-10-CM | POA: Diagnosis not present

## 2015-06-01 DIAGNOSIS — M79675 Pain in left toe(s): Secondary | ICD-10-CM | POA: Diagnosis not present

## 2015-06-01 DIAGNOSIS — I10 Essential (primary) hypertension: Secondary | ICD-10-CM | POA: Diagnosis not present

## 2015-06-01 DIAGNOSIS — M79672 Pain in left foot: Secondary | ICD-10-CM | POA: Diagnosis not present

## 2015-06-01 DIAGNOSIS — Z79899 Other long term (current) drug therapy: Secondary | ICD-10-CM | POA: Diagnosis not present

## 2015-06-04 DIAGNOSIS — R300 Dysuria: Secondary | ICD-10-CM | POA: Diagnosis not present

## 2015-06-06 ENCOUNTER — Encounter (HOSPITAL_COMMUNITY)
Admission: RE | Admit: 2015-06-06 | Discharge: 2015-06-06 | Disposition: A | Discharge status: Home / Self Care | Payer: Medicare Other | Source: Ambulatory Visit | Attending: Orthopaedic Surgery | Admitting: Orthopaedic Surgery

## 2015-06-06 DIAGNOSIS — M1712 Unilateral primary osteoarthritis, left knee: Secondary | ICD-10-CM | POA: Diagnosis not present

## 2015-06-06 DIAGNOSIS — Z01812 Encounter for preprocedural laboratory examination: Secondary | ICD-10-CM | POA: Diagnosis not present

## 2015-06-06 LAB — URINE MICROSCOPIC-ADD ON

## 2015-06-06 LAB — URINALYSIS, ROUTINE W REFLEX MICROSCOPIC
Bilirubin Urine: NEGATIVE
Glucose, UA: NEGATIVE mg/dL
Hgb urine dipstick: NEGATIVE
Ketones, ur: NEGATIVE mg/dL
NITRITE: NEGATIVE
PROTEIN: NEGATIVE mg/dL
SPECIFIC GRAVITY, URINE: 1.023 (ref 1.005–1.030)
pH: 6 (ref 5.0–8.0)

## 2015-06-06 NOTE — H&P (Signed)
CHIEF COMPLAINT:  Painful left knee.   HISTORY OF PRESENT ILLNESS:  Joanne Tran is a very pleasant 75 year old white female who is seen today for evaluation of her left knee.  She has had chronic problems with her left knee for at least a year.  She had been noted to have osteoarthritis this dating back to late 2013.  At that time, she had started having problems with the knee and had corticosteroid injections started actually in both knees over the ensuing years.  She had most recently had a corticosteroid injection to the left knee on 04/10/2015.  She has tried corticosteroid injections and is now continuing to have increasing pain and discomfort in the knee.  She now has nighttime pain noted.  She has pain with ambulation as well as activities of daily living.  She has gotten to the point now where she cannot tolerate this pain.  Seen today for evaluation.   PAST MEDICAL HISTORY:  General health is fair.   HOSPITALIZATIONS:  In 2006 for thyroid cancer and thyroidectomy.  She also had childbirth in 1969.   MEDICATIONS:  1.  Lisinopril 40 mg daily. 2.  Fluoxetine 20 mg daily. 3.  Atenolol 50 mg daily. 4.  Levothyroxine 0.088 mg daily. 5.  Hydrochlorothiazide 25 mg daily. 6.  Aleve 2 a day.   ALLERGIES:  CODEINE AND PENICILLIN.   REVIEW OF SYSTEMS:  A 14-point review of systems is positive for history of occasional dyspnea on exertion.  She also has had a heart murmur and mitral valve prolapse.  She has had hypertension for 10 years and apparently is controlled.  She had thyroid cancer and thyroidectomy.  She has had depression for 20 years and is on medications which are quite beneficial.   FAMILY HISTORY:  Positive for a mother who died at age 28 from myocardial infarction.  She had heart disease, hypertension, mental illness, and arthritis.  Father who died at age 73 from myocardial infarction.  No brothers in the family.  She has a 61 year old sister who is still alive and has bladder cancer and  had a bladder removal.   SOCIAL HISTORY:  Joanne Tran is a 75 year old white widowed female, retired from [employer].  She denies use of tobacco or alcohol.   PHYSICAL EXAMINATION:  A 75 year old white female, well-developed, well-nourished, very pleasant and cooperative in moderate distress secondary to left knee pain.  She is 5 feet 5 inches and weighs 199 pounds.  BMI is 33.1.   Vital signs reveals a temperature of 97.3, pulse 70, respirations 14, blood pressure 118/68.    Head is normocephalic.   Eyes:  Pupils equal, round, reactive to light and accommodation with extraocular movements intact.   Ears, nose and throat were benign.   Chest had good expansion.   Lungs had decreased breath sounds but were clear.   Cardiac had a regular rhythm and rate.  Distant heart sounds.  Unable to hear a murmur at this time.   Pulses 2+ bilateral and symmetric in the lower extremities.   Abdomen was scaphoid soft, nontender.  No mass palpable.  Normal bowel sounds present.   Genital, rectal and breast exam not indicated for orthopedic examination.   CNS:  She is oriented x3 and cranial nerves II-XII grossly intact.   Musculoskeletal:  Today she has range of motion from near full extension and flexes to about 100 degrees.  She does have some pseudolaxity with varus and valgus stressing.  Trace to 1+ effusion.  Skin is intact.   CLINICAL IMPRESSION:   1.  Endstage OA left knee. 2.  History of thyroid cancer. 3.  Mitral valve prolapse. 4.  History of hypertension. 5.  History of depression.   RECOMMENDATIONS:  At this time, we feel that she would be a candidate for a total knee replacement of the left knee.  Dr. Montez Morita felt that she was a candidate for total knee replacement from both a medical and cardiac standpoint.  Therefore, I have gone over the procedure with her in detail using appropriate models for the left total knee arthroplasty.  All questions were answered in detail.     Mike Craze [place], [place] [phone]  06/10/2015 11:32 AM

## 2015-06-07 LAB — URINE CULTURE
Culture: NO GROWTH
SPECIAL REQUESTS: NORMAL

## 2015-06-10 MED ORDER — VANCOMYCIN HCL IN DEXTROSE 1-5 GM/200ML-% IV SOLN
1000.0000 mg | INTRAVENOUS | Status: AC
  Administered 2015-06-11: 1000 mg via INTRAVENOUS
  Filled 2015-06-10: qty 200

## 2015-06-10 MED ORDER — ACETAMINOPHEN 10 MG/ML IV SOLN
1000.0000 mg | Freq: Once | INTRAVENOUS | Status: AC
  Administered 2015-06-11: 1000 mg via INTRAVENOUS

## 2015-06-10 MED ORDER — TRANEXAMIC ACID 1000 MG/10ML IV SOLN
2000.0000 mg | INTRAVENOUS | Status: DC
  Filled 2015-06-10: qty 20

## 2015-06-11 ENCOUNTER — Encounter (HOSPITAL_COMMUNITY): Payer: Self-pay | Admitting: General Practice

## 2015-06-11 ENCOUNTER — Inpatient Hospital Stay (HOSPITAL_COMMUNITY)
Admission: RE | Admit: 2015-06-11 | Discharge: 2015-06-14 | DRG: 470 | Disposition: A | Discharge status: Skilled Nursing Facility | Payer: Medicare Other | Source: Ambulatory Visit | Attending: Orthopaedic Surgery | Admitting: Orthopaedic Surgery

## 2015-06-11 ENCOUNTER — Encounter (HOSPITAL_COMMUNITY)
Admission: RE | Disposition: A | Discharge status: Skilled Nursing Facility | Payer: Self-pay | Source: Ambulatory Visit | Attending: Orthopaedic Surgery

## 2015-06-11 ENCOUNTER — Inpatient Hospital Stay (HOSPITAL_COMMUNITY): Payer: Medicare Other | Admitting: Emergency Medicine

## 2015-06-11 ENCOUNTER — Inpatient Hospital Stay (HOSPITAL_COMMUNITY): Payer: Medicare Other | Admitting: Anesthesiology

## 2015-06-11 DIAGNOSIS — E876 Hypokalemia: Secondary | ICD-10-CM | POA: Diagnosis present

## 2015-06-11 DIAGNOSIS — Z85850 Personal history of malignant neoplasm of thyroid: Secondary | ICD-10-CM

## 2015-06-11 DIAGNOSIS — F419 Anxiety disorder, unspecified: Secondary | ICD-10-CM | POA: Diagnosis present

## 2015-06-11 DIAGNOSIS — F329 Major depressive disorder, single episode, unspecified: Secondary | ICD-10-CM | POA: Diagnosis present

## 2015-06-11 DIAGNOSIS — Z96659 Presence of unspecified artificial knee joint: Secondary | ICD-10-CM

## 2015-06-11 DIAGNOSIS — I341 Nonrheumatic mitral (valve) prolapse: Secondary | ICD-10-CM | POA: Diagnosis not present

## 2015-06-11 DIAGNOSIS — M6281 Muscle weakness (generalized): Secondary | ICD-10-CM | POA: Diagnosis not present

## 2015-06-11 DIAGNOSIS — G8918 Other acute postprocedural pain: Secondary | ICD-10-CM | POA: Diagnosis not present

## 2015-06-11 DIAGNOSIS — M65862 Other synovitis and tenosynovitis, left lower leg: Secondary | ICD-10-CM | POA: Diagnosis present

## 2015-06-11 DIAGNOSIS — Z471 Aftercare following joint replacement surgery: Secondary | ICD-10-CM | POA: Diagnosis not present

## 2015-06-11 DIAGNOSIS — Z96652 Presence of left artificial knee joint: Secondary | ICD-10-CM | POA: Diagnosis not present

## 2015-06-11 DIAGNOSIS — M659 Synovitis and tenosynovitis, unspecified: Secondary | ICD-10-CM | POA: Diagnosis not present

## 2015-06-11 DIAGNOSIS — K458 Other specified abdominal hernia without obstruction or gangrene: Secondary | ICD-10-CM | POA: Diagnosis not present

## 2015-06-11 DIAGNOSIS — D62 Acute posthemorrhagic anemia: Secondary | ICD-10-CM | POA: Diagnosis not present

## 2015-06-11 DIAGNOSIS — R262 Difficulty in walking, not elsewhere classified: Secondary | ICD-10-CM | POA: Diagnosis not present

## 2015-06-11 DIAGNOSIS — M1712 Unilateral primary osteoarthritis, left knee: Principal | ICD-10-CM

## 2015-06-11 DIAGNOSIS — M25762 Osteophyte, left knee: Secondary | ICD-10-CM | POA: Diagnosis present

## 2015-06-11 DIAGNOSIS — K449 Diaphragmatic hernia without obstruction or gangrene: Secondary | ICD-10-CM | POA: Diagnosis not present

## 2015-06-11 DIAGNOSIS — I10 Essential (primary) hypertension: Secondary | ICD-10-CM | POA: Diagnosis present

## 2015-06-11 DIAGNOSIS — M47892 Other spondylosis, cervical region: Secondary | ICD-10-CM | POA: Diagnosis not present

## 2015-06-11 DIAGNOSIS — M179 Osteoarthritis of knee, unspecified: Secondary | ICD-10-CM | POA: Diagnosis not present

## 2015-06-11 DIAGNOSIS — M109 Gout, unspecified: Secondary | ICD-10-CM | POA: Diagnosis present

## 2015-06-11 DIAGNOSIS — F330 Major depressive disorder, recurrent, mild: Secondary | ICD-10-CM | POA: Diagnosis not present

## 2015-06-11 HISTORY — PX: TOTAL KNEE ARTHROPLASTY: SHX125

## 2015-06-11 HISTORY — DX: Gout, unspecified: M10.9

## 2015-06-11 SURGERY — ARTHROPLASTY, KNEE, TOTAL
Anesthesia: Monitor Anesthesia Care | Site: Knee | Laterality: Left

## 2015-06-11 MED ORDER — ONDANSETRON HCL 4 MG/2ML IJ SOLN: 4.0000 mg | Freq: Four times a day (QID) | INTRAMUSCULAR | Status: DC | PRN

## 2015-06-11 MED ORDER — LEVOTHYROXINE SODIUM 88 MCG PO TABS
88.0000 ug | ORAL_TABLET | Freq: Every day | ORAL | Status: DC
  Administered 2015-06-12 – 2015-06-14 (×3): 88 ug via ORAL
  Filled 2015-06-11 (×3): qty 1

## 2015-06-11 MED ORDER — CHLORHEXIDINE GLUCONATE 4 % EX LIQD: 60.0000 mL | Freq: Once | CUTANEOUS | Status: DC

## 2015-06-11 MED ORDER — DOCUSATE SODIUM 100 MG PO CAPS
100.0000 mg | ORAL_CAPSULE | Freq: Two times a day (BID) | ORAL | Status: DC
  Administered 2015-06-11 – 2015-06-14 (×7): 100 mg via ORAL
  Filled 2015-06-11 (×7): qty 1

## 2015-06-11 MED ORDER — RIVAROXABAN 10 MG PO TABS
10.0000 mg | ORAL_TABLET | Freq: Every day | ORAL | Status: DC
  Administered 2015-06-12 – 2015-06-14 (×3): 10 mg via ORAL
  Filled 2015-06-11 (×3): qty 1

## 2015-06-11 MED ORDER — BUPIVACAINE-EPINEPHRINE (PF) 0.25% -1:200000 IJ SOLN
INTRAMUSCULAR | Status: AC
  Filled 2015-06-11: qty 30

## 2015-06-11 MED ORDER — BISACODYL 10 MG RE SUPP: 10.0000 mg | Freq: Every day | RECTAL | Status: DC | PRN

## 2015-06-11 MED ORDER — PROPOFOL 10 MG/ML IV BOLUS
INTRAVENOUS | Status: DC | PRN
  Administered 2015-06-11: 30 mg via INTRAVENOUS
  Administered 2015-06-11: 170 mg via INTRAVENOUS

## 2015-06-11 MED ORDER — LISINOPRIL 40 MG PO TABS
40.0000 mg | ORAL_TABLET | Freq: Every day | ORAL | Status: DC
  Administered 2015-06-12 – 2015-06-14 (×3): 40 mg via ORAL
  Filled 2015-06-11 (×3): qty 1

## 2015-06-11 MED ORDER — SODIUM CHLORIDE 0.9 % IV SOLN
INTRAVENOUS | Status: DC
Start: 2015-06-11 — End: 2015-06-11

## 2015-06-11 MED ORDER — TRANEXAMIC ACID 1000 MG/10ML IV SOLN
2000.0000 mg | INTRAVENOUS | Status: DC | PRN
  Administered 2015-06-11: 2000 mg via TOPICAL

## 2015-06-11 MED ORDER — FENTANYL CITRATE (PF) 100 MCG/2ML IJ SOLN
INTRAMUSCULAR | Status: DC | PRN
  Administered 2015-06-11: 100 ug via INTRAVENOUS
  Administered 2015-06-11: 25 ug via INTRAVENOUS
  Administered 2015-06-11: 100 ug via INTRAVENOUS
  Administered 2015-06-11: 25 ug via INTRAVENOUS
  Administered 2015-06-11: 50 ug via INTRAVENOUS

## 2015-06-11 MED ORDER — ACETAMINOPHEN 10 MG/ML IV SOLN
INTRAVENOUS | Status: AC
  Filled 2015-06-11: qty 100

## 2015-06-11 MED ORDER — MENTHOL 3 MG MT LOZG: 1.0000 | LOZENGE | OROMUCOSAL | Status: DC | PRN

## 2015-06-11 MED ORDER — ACETAMINOPHEN 10 MG/ML IV SOLN
1000.0000 mg | Freq: Four times a day (QID) | INTRAVENOUS | Status: AC
  Administered 2015-06-11 – 2015-06-12 (×4): 1000 mg via INTRAVENOUS
  Filled 2015-06-11 (×4): qty 100

## 2015-06-11 MED ORDER — MAGNESIUM CITRATE PO SOLN: 1.0000 | Freq: Once | ORAL | Status: DC | PRN

## 2015-06-11 MED ORDER — METOCLOPRAMIDE HCL 5 MG/ML IJ SOLN: 5.0000 mg | Freq: Three times a day (TID) | INTRAMUSCULAR | Status: DC | PRN

## 2015-06-11 MED ORDER — ATENOLOL 50 MG PO TABS
50.0000 mg | ORAL_TABLET | Freq: Every day | ORAL | Status: DC
  Administered 2015-06-13 – 2015-06-14 (×2): 50 mg via ORAL
  Filled 2015-06-11 (×3): qty 1

## 2015-06-11 MED ORDER — PHENYLEPHRINE 40 MCG/ML (10ML) SYRINGE FOR IV PUSH (FOR BLOOD PRESSURE SUPPORT)
PREFILLED_SYRINGE | INTRAVENOUS | Status: AC
  Filled 2015-06-11: qty 10

## 2015-06-11 MED ORDER — PHENYLEPHRINE HCL 10 MG/ML IJ SOLN
INTRAMUSCULAR | Status: DC | PRN
  Administered 2015-06-11 (×3): 80 ug via INTRAVENOUS
  Administered 2015-06-11 (×2): 40 ug via INTRAVENOUS
  Administered 2015-06-11: 80 ug via INTRAVENOUS

## 2015-06-11 MED ORDER — BUPIVACAINE-EPINEPHRINE 0.25% -1:200000 IJ SOLN
INTRAMUSCULAR | Status: DC | PRN
  Administered 2015-06-11: 30 mL

## 2015-06-11 MED ORDER — ONDANSETRON HCL 4 MG/2ML IJ SOLN
INTRAMUSCULAR | Status: DC | PRN
  Administered 2015-06-11: 4 mg via INTRAVENOUS

## 2015-06-11 MED ORDER — ALUM & MAG HYDROXIDE-SIMETH 200-200-20 MG/5ML PO SUSP: 30.0000 mL | ORAL | Status: DC | PRN

## 2015-06-11 MED ORDER — MIDAZOLAM HCL 5 MG/5ML IJ SOLN
INTRAMUSCULAR | Status: DC | PRN
  Administered 2015-06-11: 2 mg via INTRAVENOUS

## 2015-06-11 MED ORDER — FLUOXETINE HCL 20 MG PO CAPS
20.0000 mg | ORAL_CAPSULE | Freq: Every day | ORAL | Status: DC
  Administered 2015-06-12 – 2015-06-14 (×3): 20 mg via ORAL
  Filled 2015-06-11 (×3): qty 1

## 2015-06-11 MED ORDER — HYDROCHLOROTHIAZIDE 25 MG PO TABS
25.0000 mg | ORAL_TABLET | Freq: Every day | ORAL | Status: DC
  Filled 2015-06-11: qty 1

## 2015-06-11 MED ORDER — FENTANYL CITRATE (PF) 250 MCG/5ML IJ SOLN
INTRAMUSCULAR | Status: AC
  Filled 2015-06-11: qty 5

## 2015-06-11 MED ORDER — SODIUM CHLORIDE 0.9 % IR SOLN
Status: DC | PRN
  Administered 2015-06-11: 1000 mL

## 2015-06-11 MED ORDER — OXYCODONE HCL 5 MG PO TABS
5.0000 mg | ORAL_TABLET | ORAL | Status: DC | PRN
  Administered 2015-06-11: 5 mg via ORAL
  Administered 2015-06-11 – 2015-06-12 (×3): 10 mg via ORAL
  Administered 2015-06-12: 5 mg via ORAL
  Administered 2015-06-12 – 2015-06-14 (×6): 10 mg via ORAL
  Filled 2015-06-11: qty 2
  Filled 2015-06-11: qty 1
  Filled 2015-06-11 (×5): qty 2
  Filled 2015-06-11: qty 1
  Filled 2015-06-11 (×2): qty 2

## 2015-06-11 MED ORDER — METHOCARBAMOL 500 MG PO TABS
500.0000 mg | ORAL_TABLET | Freq: Four times a day (QID) | ORAL | Status: DC | PRN
  Administered 2015-06-11 – 2015-06-14 (×2): 500 mg via ORAL
  Filled 2015-06-11 (×2): qty 1

## 2015-06-11 MED ORDER — LACTATED RINGERS IV SOLN
INTRAVENOUS | Status: DC | PRN
Start: 2015-06-11 — End: 2015-06-11
  Administered 2015-06-11 (×2): via INTRAVENOUS

## 2015-06-11 MED ORDER — OXYCODONE HCL 5 MG/5ML PO SOLN: 5.0000 mg | Freq: Once | ORAL | Status: DC | PRN

## 2015-06-11 MED ORDER — LIDOCAINE 2% (20 MG/ML) 5 ML SYRINGE
INTRAMUSCULAR | Status: AC
  Filled 2015-06-11: qty 5

## 2015-06-11 MED ORDER — HYDROMORPHONE HCL 1 MG/ML IJ SOLN
0.2500 mg | INTRAMUSCULAR | Status: DC | PRN
  Administered 2015-06-11 (×2): 0.5 mg via INTRAVENOUS

## 2015-06-11 MED ORDER — SODIUM CHLORIDE 0.9 % IV SOLN
INTRAVENOUS | Status: DC
  Administered 2015-06-11: 14:00:00 via INTRAVENOUS

## 2015-06-11 MED ORDER — PROPOFOL 1000 MG/100ML IV EMUL
INTRAVENOUS | Status: AC
  Filled 2015-06-11: qty 100

## 2015-06-11 MED ORDER — METOCLOPRAMIDE HCL 5 MG PO TABS: 5.0000 mg | ORAL_TABLET | Freq: Three times a day (TID) | ORAL | Status: DC | PRN

## 2015-06-11 MED ORDER — KETOROLAC TROMETHAMINE 15 MG/ML IJ SOLN
7.5000 mg | Freq: Four times a day (QID) | INTRAMUSCULAR | Status: AC
  Administered 2015-06-11 – 2015-06-12 (×4): 7.5 mg via INTRAVENOUS
  Filled 2015-06-11 (×3): qty 1

## 2015-06-11 MED ORDER — PHENOL 1.4 % MT LIQD: 1.0000 | OROMUCOSAL | Status: DC | PRN

## 2015-06-11 MED ORDER — VANCOMYCIN HCL IN DEXTROSE 1-5 GM/200ML-% IV SOLN
1000.0000 mg | Freq: Two times a day (BID) | INTRAVENOUS | Status: AC
  Administered 2015-06-11: 1000 mg via INTRAVENOUS
  Filled 2015-06-11: qty 200

## 2015-06-11 MED ORDER — COLCHICINE 0.6 MG PO TABS
0.6000 mg | ORAL_TABLET | Freq: Two times a day (BID) | ORAL | Status: DC
  Administered 2015-06-11 – 2015-06-14 (×7): 0.6 mg via ORAL
  Filled 2015-06-11 (×7): qty 1

## 2015-06-11 MED ORDER — KETOROLAC TROMETHAMINE 15 MG/ML IJ SOLN
INTRAMUSCULAR | Status: AC
  Administered 2015-06-11: 7.5 mg via INTRAVENOUS
  Filled 2015-06-11: qty 1

## 2015-06-11 MED ORDER — DEXTROSE 5 % IV SOLN
500.0000 mg | Freq: Four times a day (QID) | INTRAVENOUS | Status: DC | PRN
  Filled 2015-06-11: qty 5

## 2015-06-11 MED ORDER — MIDAZOLAM HCL 2 MG/2ML IJ SOLN
INTRAMUSCULAR | Status: AC
  Filled 2015-06-11: qty 2

## 2015-06-11 MED ORDER — OXYCODONE HCL 5 MG PO TABS: 5.0000 mg | ORAL_TABLET | Freq: Once | ORAL | Status: DC | PRN

## 2015-06-11 MED ORDER — HYDROMORPHONE HCL 1 MG/ML IJ SOLN
INTRAMUSCULAR | Status: AC
  Administered 2015-06-11: 0.5 mg via INTRAVENOUS
  Filled 2015-06-11: qty 1

## 2015-06-11 MED ORDER — BUPIVACAINE-EPINEPHRINE (PF) 0.5% -1:200000 IJ SOLN
INTRAMUSCULAR | Status: DC | PRN
  Administered 2015-06-11: 20 mL via PERINEURAL

## 2015-06-11 MED ORDER — DIPHENHYDRAMINE HCL 12.5 MG/5ML PO ELIX
12.5000 mg | ORAL_SOLUTION | ORAL | Status: DC | PRN
  Administered 2015-06-12 – 2015-06-14 (×6): 25 mg via ORAL
  Filled 2015-06-11 (×6): qty 10

## 2015-06-11 MED ORDER — OXYCODONE HCL 5 MG PO TABS
ORAL_TABLET | ORAL | Status: AC
  Administered 2015-06-11: 10 mg via ORAL
  Filled 2015-06-11: qty 2

## 2015-06-11 MED ORDER — SODIUM CHLORIDE 0.9 % IR SOLN
Status: DC | PRN
Start: 2015-06-11 — End: 2015-06-11
  Administered 2015-06-11: 1000 mL

## 2015-06-11 MED ORDER — ONDANSETRON HCL 4 MG PO TABS: 4.0000 mg | ORAL_TABLET | Freq: Four times a day (QID) | ORAL | Status: DC | PRN

## 2015-06-11 MED ORDER — POLYETHYLENE GLYCOL 3350 17 G PO PACK: 17.0000 g | PACK | Freq: Every day | ORAL | Status: DC | PRN

## 2015-06-11 SURGICAL SUPPLY — 66 items
BAG DECANTER FOR FLEXI CONT (MISCELLANEOUS) ×3 IMPLANT
BANDAGE ESMARK 6X9 LF (GAUZE/BANDAGES/DRESSINGS) ×1 IMPLANT
BLADE SAGITTAL 25.0X1.19X90 (BLADE) ×2 IMPLANT
BLADE SAGITTAL 25.0X1.19X90MM (BLADE) ×1
BNDG ESMARK 6X9 LF (GAUZE/BANDAGES/DRESSINGS) ×3
BOWL SMART MIX CTS (DISPOSABLE) ×3 IMPLANT
CAP KNEE TOTAL 3 SIGMA ×3 IMPLANT
CEMENT HV SMART SET (Cement) ×6 IMPLANT
COVER SURGICAL LIGHT HANDLE (MISCELLANEOUS) ×3 IMPLANT
CUFF TOURNIQUET SINGLE 34IN LL (TOURNIQUET CUFF) ×3 IMPLANT
CUFF TOURNIQUET SINGLE 44IN (TOURNIQUET CUFF) IMPLANT
DECANTER SPIKE VIAL GLASS SM (MISCELLANEOUS) ×3 IMPLANT
DRAPE EXTREMITY T 121X128X90 (DRAPE) ×3 IMPLANT
DRAPE PROXIMA HALF (DRAPES) ×3 IMPLANT
DRSG ADAPTIC 3X8 NADH LF (GAUZE/BANDAGES/DRESSINGS) ×3 IMPLANT
DRSG PAD ABDOMINAL 8X10 ST (GAUZE/BANDAGES/DRESSINGS) ×3 IMPLANT
DURAPREP 26ML APPLICATOR (WOUND CARE) ×6 IMPLANT
ELECT CAUTERY BLADE 6.4 (BLADE) ×3 IMPLANT
ELECT REM PT RETURN 9FT ADLT (ELECTROSURGICAL) ×3
ELECTRODE REM PT RTRN 9FT ADLT (ELECTROSURGICAL) ×1 IMPLANT
EVACUATOR 1/8 PVC DRAIN (DRAIN) ×3 IMPLANT
FACESHIELD WRAPAROUND (MASK) ×9 IMPLANT
GAUZE SPONGE 4X4 12PLY STRL (GAUZE/BANDAGES/DRESSINGS) IMPLANT
GLOVE BIOGEL PI IND STRL 6.5 (GLOVE) ×2 IMPLANT
GLOVE BIOGEL PI IND STRL 8 (GLOVE) ×3 IMPLANT
GLOVE BIOGEL PI IND STRL 8.5 (GLOVE) ×1 IMPLANT
GLOVE BIOGEL PI INDICATOR 6.5 (GLOVE) ×4
GLOVE BIOGEL PI INDICATOR 8 (GLOVE) ×6
GLOVE BIOGEL PI INDICATOR 8.5 (GLOVE) ×2
GLOVE ECLIPSE 8.0 STRL XLNG CF (GLOVE) ×6 IMPLANT
GLOVE SURG ORTHO 8.5 STRL (GLOVE) ×6 IMPLANT
GLOVE SURG SS PI 6.5 STRL IVOR (GLOVE) ×6 IMPLANT
GOWN STRL REUS W/ TWL LRG LVL3 (GOWN DISPOSABLE) ×3 IMPLANT
GOWN STRL REUS W/TWL 2XL LVL3 (GOWN DISPOSABLE) ×3 IMPLANT
GOWN STRL REUS W/TWL LRG LVL3 (GOWN DISPOSABLE) ×6
HANDPIECE INTERPULSE COAX TIP (DISPOSABLE) ×2
KIT BASIN OR (CUSTOM PROCEDURE TRAY) ×3 IMPLANT
KIT ROOM TURNOVER OR (KITS) ×3 IMPLANT
MANIFOLD NEPTUNE II (INSTRUMENTS) ×3 IMPLANT
NEEDLE 22X1 1/2 (OR ONLY) (NEEDLE) ×3 IMPLANT
NS IRRIG 1000ML POUR BTL (IV SOLUTION) ×3 IMPLANT
PACK TOTAL JOINT (CUSTOM PROCEDURE TRAY) ×3 IMPLANT
PAD ARMBOARD 7.5X6 YLW CONV (MISCELLANEOUS) ×3 IMPLANT
PAD CAST 4YDX4 CTTN HI CHSV (CAST SUPPLIES) ×1 IMPLANT
PADDING CAST COTTON 4X4 STRL (CAST SUPPLIES) ×2
PADDING CAST COTTON 6X4 STRL (CAST SUPPLIES) ×3 IMPLANT
SET HNDPC FAN SPRY TIP SCT (DISPOSABLE) ×1 IMPLANT
SPONGE GAUZE 4X4 12PLY STER LF (GAUZE/BANDAGES/DRESSINGS) ×3 IMPLANT
STAPLER VISISTAT 35W (STAPLE) ×3 IMPLANT
SUCTION FRAZIER HANDLE 10FR (MISCELLANEOUS) ×2
SUCTION TUBE FRAZIER 10FR DISP (MISCELLANEOUS) ×1 IMPLANT
SURGIFLO W/THROMBIN 8M KIT (HEMOSTASIS) IMPLANT
SUT BONE WAX W31G (SUTURE) ×3 IMPLANT
SUT ETHIBOND NAB CT1 #1 30IN (SUTURE) ×6 IMPLANT
SUT MNCRL AB 3-0 PS2 18 (SUTURE) ×3 IMPLANT
SUT VIC AB 0 CT1 27 (SUTURE) ×4
SUT VIC AB 0 CT1 27XBRD ANBCTR (SUTURE) ×2 IMPLANT
SUT VIC AB 2-0 CT1 27 (SUTURE) ×4
SUT VIC AB 2-0 CT1 TAPERPNT 27 (SUTURE) ×2 IMPLANT
SYR CONTROL 10ML LL (SYRINGE) ×3 IMPLANT
TOWEL OR 17X24 6PK STRL BLUE (TOWEL DISPOSABLE) ×3 IMPLANT
TOWEL OR 17X26 10 PK STRL BLUE (TOWEL DISPOSABLE) ×3 IMPLANT
TRAY FOLEY CATH 16FRSI W/METER (SET/KITS/TRAYS/PACK) ×3 IMPLANT
UPCHARGE REV TRAY MBT KNEE ×3 IMPLANT
WATER STERILE IRR 1000ML POUR (IV SOLUTION) IMPLANT
WRAP KNEE MAXI GEL POST OP (GAUZE/BANDAGES/DRESSINGS) ×3 IMPLANT

## 2015-06-11 NOTE — Evaluation (Signed)
Physical Therapy Evaluation Patient Details Name: Joanne Tran MRN: [id number] DOB: [birth year] Today's Date: 06/11/2015   History of Present Illness  Admitted for LTKA; PWB 50% LLE;  has a past medical history of Major depression ([place]); History of thyroid cancer (March 2009); Cervical spondylosis; Hiatal hernia; History of mitral valve prolapse; Essential hypertension; Cancer ([place]); Heart murmur; Anxiety; Spinal headache; and Gout.   Clinical Impression   Pt is s/p TKA resulting in the deficits listed below (see PT Problem List).  Pt will benefit from skilled PT to increase their independence and safety with mobility to allow discharge to the venue listed below.      Follow Up Recommendations SNF    Equipment Recommendations  Rolling walker with 5" wheels;3in1 (PT)    Recommendations for Other Services       Precautions / Restrictions Precautions Precautions: Knee Precaution Booklet Issued: Yes (comment) Precaution Comments: Pt educated to not allow any pillow or bolster under knee for healing with optimal range of motion.  Restrictions Weight Bearing Restrictions: Yes LLE Weight Bearing: Partial weight bearing LLE Partial Weight Bearing Percentage or Pounds: 50      Mobility  Bed Mobility Overal bed mobility: Needs Assistance Bed Mobility: Supine to Sit     Supine to sit: Supervision;HOB elevated     General bed mobility comments: Cues for technqiue; used rails  Transfers Overall transfer level: Needs assistance Equipment used: Rolling walker (2 wheeled) Transfers: Sit to/from Stand Sit to Stand: Min assist         General transfer comment: Min assist to power up; cues for safety and hand placement  Ambulation/Gait Ambulation/Gait assistance: Min guard Ambulation Distance (Feet):  (pivotal steps bed to chair ) Assistive device: Rolling walker (2 wheeled)       General Gait Details: Cues for gati sequence and 50%PWB; cues also to activate quad for  stance stability  Stairs            Wheelchair Mobility    Modified Rankin (Stroke Patients Only)       Balance                                             Pertinent Vitals/Pain Pain Assessment: 0-10 Pain Score: 3  Pain Location: L knee distal quad Pain Descriptors / Indicators: Aching Pain Intervention(s): Monitored during session    Home Living Family/patient expects to be discharged to:: Skilled nursing facility Living Arrangements: Alone               Additional Comments: Plans to go to [place]    Prior Function Level of Independence: Independent               Hand Dominance        Extremity/Trunk Assessment   Upper Extremity Assessment: Overall WFL for tasks assessed           Lower Extremity Assessment: LLE deficits/detail   LLE Deficits / Details: Grossly decr AROM and strength postop; good quad set; actively flexes to approx 60 deg     Communication   Communication: No difficulties  Cognition Arousal/Alertness: Awake/alert Behavior During Therapy: WFL for tasks assessed/performed Overall Cognitive Status: Within Functional Limits for tasks assessed                      General Comments  Exercises Total Joint Exercises Quad Sets: AROM;Both;10 reps Heel Slides: AROM;Left;5 reps Straight Leg Raises: AROM;Left (3)      Assessment/Plan    PT Assessment Patient needs continued PT services  PT Diagnosis Difficulty walking;Acute pain   PT Problem List Decreased strength;Decreased range of motion;Decreased activity tolerance;Decreased balance;Decreased mobility;Decreased knowledge of use of DME;Decreased knowledge of precautions;Pain  PT Treatment Interventions DME instruction;Gait training;Functional mobility training;Therapeutic activities;Therapeutic exercise;Balance training;Patient/family education   PT Goals (Current goals can be found in the Care Plan section) Acute Rehab PT  Goals Patient Stated Goal: would like to get back to traveling PT Goal Formulation: With patient Time For Goal Achievement: 06/18/15 Potential to Achieve Goals: Good    Frequency BID   Barriers to discharge        Co-evaluation               End of Session Equipment Utilized During Treatment: Gait belt Activity Tolerance: Patient tolerated treatment well Patient left: in chair;with call bell/phone within reach;with family/visitor present Nurse Communication: Mobility status         Time: 1500-1536 PT Time Calculation (min) (ACUTE ONLY): 36 min   Charges:   PT Evaluation $PT Eval Moderate Complexity: 1 Procedure PT Treatments $Therapeutic Activity: 8-22 mins   PT G Codes:        Quin Hoop 06/11/2015, 4:24 PM  Roney Marion, [place] Pager [phone] Office [phone]

## 2015-06-11 NOTE — Progress Notes (Signed)
Orthopedic Tech Progress Note Patient Details:  Joanne Tran [birth year] [id number]  CPM Left Knee CPM Left Knee: On Left Knee Flexion (Degrees): 90 Left Knee Extension (Degrees): 0   [name] 06/11/2015, 6:53 PM

## 2015-06-11 NOTE — Progress Notes (Signed)
OT Cancellation Note  Patient Details Name: Joanne Tran MRN: [id number] DOB: Jan 31, 1940   Cancelled Treatment:    Reason Eval/Treat Not Completed:  (OT screened) Pt is Medicare and current D/C plan is SNF. No apparent immediate acute care OT needs, therefore will defer OT to SNF. If OT eval is needed please call Acute Rehab Dept. at [phone] or text page OT at [phone].    Benito Mccreedy OTR/L [id number] 06/11/2015, 5:03 PM

## 2015-06-11 NOTE — Op Note (Signed)
PATIENT ID:      Joanne Tran  MRN:     [id number] DOB/AGE:    1940/04/20 / 75 y.o.       OPERATIVE REPORT    DATE OF PROCEDURE:  06/11/2015       PREOPERATIVE DIAGNOSIS:   LEFT KNEE END STAGE OSTEOARTHRITIS                                                       Estimated body mass index is 33.6 kg/(m^2) as calculated from the following:   Height as of this encounter: 5\' 5"  (1.651 m).   Weight as of this encounter: 91.581 kg (201 lb 14.4 oz).     POSTOPERATIVE DIAGNOSIS:   LEFT KNEE END STAGE OSTEOARTHRITIS                                                                     Estimated body mass index is 33.6 kg/(m^2) as calculated from the following:   Height as of this encounter: 5\' 5"  (1.651 m).   Weight as of this encounter: 91.581 kg (201 lb 14.4 oz).     PROCEDURE:  Procedure(s): LEFT TOTAL KNEE ARTHROPLASTY     SURGEON:  Joni Fears, MD    ASSISTANT:   Biagio Borg, PA-C   (Present and scrubbed throughout the case, critical for assistance with exposure, retraction, instrumentation, and closure.)          ANESTHESIA: regional and general     DRAINS: (LEFT KNEE) Hemovact drain(s) in the CLAMPED with  Suction Clamped :      TOURNIQUET TIME:  Total Tourniquet Time Documented: Thigh (Left) - 75 minutes Total: Thigh (Left) - 75 minutes     COMPLICATIONS:  None   CONDITION:  stable  PROCEDURE IN DETAILMS:[id number]   Joanne Tran W 06/11/2015, 9:28 AM

## 2015-06-11 NOTE — Anesthesia Procedure Notes (Addendum)
Procedure Name: LMA Insertion Date/Time: 06/11/2015 7:31 AM Performed by: Manus Gunning, HAL J Pre-anesthesia Checklist: Patient identified, Timeout performed, Emergency Drugs available, Suction available and Patient being monitored Patient Re-evaluated:Patient Re-evaluated prior to inductionOxygen Delivery Method: Circle system utilized Preoxygenation: Pre-oxygenation with 100% oxygen Intubation Type: IV induction Ventilation: Mask ventilation without difficulty LMA: LMA inserted LMA Size: 4.0 Number of attempts: 1 Placement Confirmation: positive ETCO2 and breath sounds checked- equal and bilateral Tube secured with: Tape Dental Injury: Teeth and Oropharynx as per pre-operative assessment    Anesthesia Regional Block:  Adductor canal block  Pre-Anesthetic Checklist: ,, timeout performed, Correct Patient, Correct Site, Correct Laterality, Correct Procedure, Correct Position, site marked, Risks and benefits discussed,  Surgical consent,  Pre-op evaluation,  At surgeon's request and post-op pain management  Laterality: Left  Prep: chloraprep       Needles:  Injection technique: Single-shot  Needle Type: Echogenic Needle     Needle Length: 9cm 9 cm Needle Gauge: 21 and 21 G    Additional Needles:  Procedures: ultrasound guided (picture in chart) Adductor canal block Narrative:  Start time: 06/11/2015 7:13 AM End time: 06/11/2015 7:18 AM Injection made incrementally with aspirations every 5 mL.  Performed by: Personally  Anesthesiologist: Sheretta Grumbine  Additional Notes: Pt tolerated the procedure well.

## 2015-06-11 NOTE — Care Management Note (Signed)
Case Management Note  Patient Details  Name: MCKENLIE GRANDJEAN MRN: [id number] Date of Birth: 07/23/1940  Subjective/Objective:       L TKA 06/11/2015. Pt is PWB 50%. PT evaluation recommending SNF at discharge. CM placed referral to CSW for disposition.              Action/Plan:CM will sign off for now but will be available should additional discharge needs arise or disposition change.    Expected Discharge Date:                  Expected Discharge Plan:     In-House Referral:  Clinical Social Work  Discharge planning Services  CM Consult  Post Acute Care Choice:    Choice offered to:     DME Arranged:    DME Agency:     HH Arranged:    [place] Agency:     Status of Service:  Completed, signed off  Medicare Important Message Given:    Date Medicare IM Given:    Medicare IM give by:    Date Additional Medicare IM Given:    Additional Medicare Important Message give by:     If discussed at [place] of Stay Meetings, dates discussed:    Additional Comments:  Delrae Sawyers, RN 06/11/2015, 4:48 PM

## 2015-06-11 NOTE — Anesthesia Preprocedure Evaluation (Signed)
Anesthesia Evaluation  Patient identified by MRN, date of birth, ID band Patient awake    Reviewed: Allergy & Precautions, NPO status , Patient's Chart, lab work & pertinent test results  History of Anesthesia Complications (+) POST - OP SPINAL HEADACHE and history of anesthetic complications  Airway Mallampati: II   Neck ROM: full    Dental   Pulmonary    breath sounds clear to auscultation       Cardiovascular hypertension,  Rhythm:regular Rate:Normal     Neuro/Psych  Headaches, Anxiety Depression  Neuromuscular disease    GI/Hepatic hiatal hernia,   Endo/Other  obese  Renal/GU      Musculoskeletal  (+) Arthritis ,   Abdominal   Peds  Hematology   Anesthesia Other Findings   Reproductive/Obstetrics                             Anesthesia Physical Anesthesia Plan  ASA: II  Anesthesia Plan: MAC and Spinal   Post-op Pain Management:    Induction: Intravenous  Airway Management Planned: Simple Face Mask  Additional Equipment:   Intra-op Plan:   Post-operative Plan:   Informed Consent: I have reviewed the patients History and Physical, chart, labs and discussed the procedure including the risks, benefits and alternatives for the proposed anesthesia with the patient or authorized representative who has indicated his/her understanding and acceptance.     Plan Discussed with: CRNA, Anesthesiologist and Surgeon  Anesthesia Plan Comments:         Anesthesia Quick Evaluation

## 2015-06-11 NOTE — H&P (Signed)
  The recent History & Physical has been reviewed. I have personally examined the patient today. There is no interval change to the documented History & Physical. The patient would like to proceed with the procedure.  Joni Fears W 06/11/2015,  7:12 AM

## 2015-06-11 NOTE — Transfer of Care (Signed)
Immediate Anesthesia Transfer of Care Note  Patient: Joanne Tran  Procedure(s) Performed: Procedure(s): LEFT TOTAL KNEE ARTHROPLASTY (Left)  Patient Location: PACU  Anesthesia Type:General  Level of Consciousness: awake  Airway & Oxygen Therapy: Patient Spontanous Breathing and Patient connected to nasal cannula oxygen  Post-op Assessment: Report given to RN and Post -op Vital signs reviewed and stable  Post vital signs: Reviewed and stable  Last Vitals:  Filed Vitals:   06/11/15 0605  BP: 158/59  Pulse: 81  Temp: 36.6 C  Resp: 18    Last Pain:  Filed Vitals:   06/11/15 0613  PainSc: 6       Patients Stated Pain Goal: 4 ([id number])  Complications: No apparent anesthesia complications

## 2015-06-12 ENCOUNTER — Encounter (HOSPITAL_COMMUNITY): Payer: Self-pay | Admitting: Orthopaedic Surgery

## 2015-06-12 LAB — CBC
HEMATOCRIT: 32 % — AB (ref 36.0–46.0)
Hemoglobin: 9.8 g/dL — ABNORMAL LOW (ref 12.0–15.0)
MCH: 27.1 pg (ref 26.0–34.0)
MCHC: 30.6 g/dL (ref 30.0–36.0)
MCV: 88.4 fL (ref 78.0–100.0)
Platelets: 193 10*3/uL (ref 150–400)
RBC: 3.62 MIL/uL — ABNORMAL LOW (ref 3.87–5.11)
RDW: 13.7 % (ref 11.5–15.5)
WBC: 10.2 10*3/uL (ref 4.0–10.5)

## 2015-06-12 LAB — BASIC METABOLIC PANEL
ANION GAP: 11 (ref 5–15)
BUN: 15 mg/dL (ref 6–20)
CALCIUM: 7.8 mg/dL — AB (ref 8.9–10.3)
CO2: 23 mmol/L (ref 22–32)
CREATININE: 1.12 mg/dL — AB (ref 0.44–1.00)
Chloride: 103 mmol/L (ref 101–111)
GFR, EST AFRICAN AMERICAN: 55 mL/min — AB (ref >60)
GFR, EST NON AFRICAN AMERICAN: 48 mL/min — AB (ref >60)
Glucose, Bld: 117 mg/dL — ABNORMAL HIGH (ref 65–99)
Potassium: 3.3 mmol/L — ABNORMAL LOW (ref 3.5–5.1)
SODIUM: 137 mmol/L (ref 135–145)

## 2015-06-12 MED ORDER — ACETAMINOPHEN 325 MG PO TABS
650.0000 mg | ORAL_TABLET | Freq: Four times a day (QID) | ORAL | Status: DC
  Administered 2015-06-12 – 2015-06-14 (×8): 650 mg via ORAL
  Filled 2015-06-12 (×8): qty 2

## 2015-06-12 NOTE — Progress Notes (Signed)
Patient ID: Mariana KaufmanDelores S Blea, female   DOB: October 18, 1940, 75 y.o.   MRN: [id number] PATIENT ID: Mariana KaufmanDelores S Oliger        MRN:  [id number]          DOB/AGE: October 18, 1940 / 75 y.o.    Norlene CampbellPeter Whitfield, MD   Jacqualine CodeBrian Chianne Byrns, PA-C [address] [place], [place]  [address]                             [phone]   PROGRESS NOTE  Subjective:  negative for Chest Pain  negative for Shortness of Breath  negative for Nausea/Vomiting   negative for Calf Pain    Tolerating Diet: yes         Patient reports pain as mild and moderate.     Very comfortable.  Sat up for 4 hours yesterday  Objective: Vital signs in last 24 hours:   Patient Vitals for the past 24 hrs:  BP Temp Temp src Pulse Resp SpO2  06/12/15 0430 (!) 99/47 mmHg 97.9 F (36.6 C) Oral 72 14 98 %  06/12/15 0030 (!) 114/52 mmHg 98.5 F (36.9 C) Oral 75 16 94 %  06/11/15 2047 (!) 124/53 mmHg 97.9 F (36.6 C) Oral 71 16 100 %  06/11/15 1400 124/73 mmHg 98.2 F (36.8 C) - 72 16 98 %  06/11/15 1130 129/81 mmHg 98 F (36.7 C) - 75 15 97 %  06/11/15 1115 130/72 mmHg - - 79 13 96 %  06/11/15 1100 117/81 mmHg - - 82 16 99 %  06/11/15 1045 (!) 155/69 mmHg - - 85 15 97 %  06/11/15 1030 140/73 mmHg - - 87 15 97 %  06/11/15 1015 120/78 mmHg - - 84 15 97 %  06/11/15 1000 91/77 mmHg - - 84 16 96 %  06/11/15 0947 (!) 158/76 mmHg 97.9 F (36.6 C) - 97 17 96 %      Intake/Output from previous day:   06/06 0701 - 06/07 0700 In: 1350 [P.O.:150; I.V.:1200] Out: 1290 [Urine:790; Drains:450]   Intake/Output this shift:       Intake/Output      06/06 0701 - 06/07 0700 06/07 0701 - 06/08 0700   P.O. 150    I.V. (mL/kg) 1200 (13.1)    Total Intake(mL/kg) 1350 (14.7)    Urine (mL/kg/hr) 790 (0.4)    Drains 450 (0.2)    Blood 50 (0)    Total Output 1290     Net +60             LABORATORY DATA:  Recent Labs  06/12/15 0637  WBC 10.2  HGB 9.8*  HCT 32.0*  PLT 193    Recent Labs  06/12/15 0637  NA 137  K 3.3*  CL 103    CO2 23  BUN 15  CREATININE 1.12*  GLUCOSE 117*  CALCIUM 7.8*   Lab Results  Component Value Date   INR 1.01 05/30/2015   INR 0.9 03/25/2007    Recent Radiographic Studies :  Dg Chest 2 View  05/30/2015  CLINICAL DATA:  Preop evaluation prior to total knee arthroplasty. EXAM: CHEST  2 VIEW COMPARISON:  11/14/2007. FINDINGS: The heart remains normal in size. A large hiatal hernia is again demonstrated. Small amount of linear atelectasis or scarring at the left lung base. Stable possible bone island in the anterior aspect of the right second rib. Diffuse osteopenia. Mild bilateral shoulder degenerative changes. IMPRESSION: No acute  abnormality.  Stable large hiatal hernia. Electronically Signed   By: Claudie Revering M.D.   On: 05/30/2015 13:54     Examination:  General appearance: alert, cooperative, mild distress and moderate distress Resp: clear to auscultation bilaterally Cardio: regular rate and rhythm GI: normal findings: bowel sounds normal  Wound Exam: clean, dry, intact dressing  Motor Exam: EHL, FHL, Anterior Tibial and Posterior Tibial Intact  Sensory Exam: Superficial Peroneal, Deep Peroneal and Tibial normal  Vascular Exam: Left posterior tibial artery has 1+ (weak) pulse  Assessment:    1 Day Post-Op  Procedure(s) (LRB): LEFT TOTAL KNEE ARTHROPLASTY (Left)  ADDITIONAL DIAGNOSIS:  Principal Problem:   Primary osteoarthritis of left knee Active Problems:   S/P total knee replacement using cement   Plan: Physical Therapy as ordered Partial Weight Bearing @ 50% (PWB)  DVT Prophylaxis:  Xarelto, Foot Pumps and TED hose  DISCHARGE PLAN: Skilled Nursing Facility/Rehab [facility] on Friday  DISCHARGE NEEDS: CPM, Walker and 3-in-1 comode seat        [facility]  06/12/2015 7:43 AM

## 2015-06-12 NOTE — Progress Notes (Signed)
Physical Therapy Treatment Patient Details Name: Joanne Tran MRN: [id number] DOB: 05/29/1940 Today's Date: 06/12/2015    History of Present Illness Admitted for LTKA; PWB 50% LLE;  has a past medical history of Major depression ([place]); History of thyroid cancer (March 2009); Cervical spondylosis; Hiatal hernia; History of mitral valve prolapse; Essential hypertension; Cancer ([place]); Heart murmur; Anxiety; Spinal headache; and Gout.     PT Comments    Pt performed increased mobility, remains to require min guard for safety.  Will f/u this pm.    Follow Up Recommendations  SNF     Equipment Recommendations  Rolling walker with 5" wheels;3in1 (PT)    Recommendations for Other Services       Precautions / Restrictions Precautions Precautions: Knee Precaution Booklet Issued: Yes (comment) Restrictions Weight Bearing Restrictions: Yes LLE Weight Bearing: Partial weight bearing LLE Partial Weight Bearing Percentage or Pounds: 50    Mobility  Bed Mobility Overal bed mobility: Needs Assistance Bed Mobility: Supine to Sit     Supine to sit: Supervision;HOB elevated     General bed mobility comments: Cues for technqiue; used rails  Transfers Overall transfer level: Needs assistance Equipment used: Rolling walker (2 wheeled) Transfers: Sit to/from Stand Sit to Stand: Min guard         General transfer comment: Cues for hand placement of advancement of LLE forward.  Cues for WBS and sequencing to maintain.    Ambulation/Gait Ambulation/Gait assistance: Min guard Ambulation Distance (Feet): 110 Feet Assistive device: Rolling walker (2 wheeled) Gait Pattern/deviations: Step-to pattern;Antalgic;Trunk flexed     General Gait Details: Cues for gati sequence and 50%PWB; cues also to activate quad for stance stability   Stairs            Wheelchair Mobility    Modified Rankin (Stroke Patients Only)       Balance Overall balance assessment: Needs  assistance   Sitting balance-Leahy Scale: Good       Standing balance-Leahy Scale: Fair                      Cognition Arousal/Alertness: Awake/alert Behavior During Therapy: WFL for tasks assessed/performed Overall Cognitive Status: Within Functional Limits for tasks assessed                      Exercises Total Joint Exercises Ankle Circles/Pumps: AROM;Both;10 reps;Supine Quad Sets: AROM;Left;10 reps;Supine Heel Slides: AAROM;Left;10 reps;Supine Hip ABduction/ADduction: AAROM;Left;10 reps;Supine Straight Leg Raises: AAROM;Left;10 reps;Supine    General Comments        Pertinent Vitals/Pain Pain Assessment: 0-10 Pain Score: 5  Pain Descriptors / Indicators: Discomfort;Grimacing;Guarding Pain Intervention(s): Monitored during session;Repositioned;Ice applied    Home Living                      Prior Function            PT Goals (current goals can now be found in the care plan section) Acute Rehab PT Goals Patient Stated Goal: would like to get back to traveling Potential to Achieve Goals: Good Progress towards PT goals: Progressing toward goals    Frequency  BID    PT Plan      Co-evaluation             End of Session Equipment Utilized During Treatment: Gait belt Activity Tolerance: Patient tolerated treatment well Patient left: in chair;with call bell/phone within reach;with family/visitor present     Time: [id number] PT Time Calculation (  min) (ACUTE ONLY): 32 min  Charges:  $Gait Training: 8-22 mins $Therapeutic Exercise: 8-22 mins                    G Codes:      Cristela Blue 06-20-15, 1:49 PM Governor Rooks, PTA pager [phone]

## 2015-06-12 NOTE — Progress Notes (Signed)
Physical Therapy Treatment Patient Details Name: Joanne Tran MRN: [id number] DOB: 1940/07/22 Today's Date: 06/12/2015    History of Present Illness Admitted for LTKA; PWB 50% LLE;  has a past medical history of Major depression ([place]); History of thyroid cancer (March 2009); Cervical spondylosis; Hiatal hernia; History of mitral valve prolapse; Essential hypertension; Cancer ([place]); Heart murmur; Anxiety; Spinal headache; and Gout.     PT Comments    Pt able to advance mobility but remains to require min guard for safety with mobility.  Will continue to follow during acute hospitalization.    Follow Up Recommendations  SNF     Equipment Recommendations  Rolling walker with 5" wheels;3in1 (PT)    Recommendations for Other Services       Precautions / Restrictions Precautions Precautions: Knee Precaution Booklet Issued: Yes (comment) Restrictions Weight Bearing Restrictions: Yes LLE Weight Bearing: Partial weight bearing LLE Partial Weight Bearing Percentage or Pounds: 50    Mobility  Bed Mobility Overal bed mobility: Needs Assistance Bed Mobility: Supine to Sit     Supine to sit: Supervision;HOB elevated     General bed mobility comments: Cues for technqiue; used rails  Transfers Overall transfer level: Needs assistance Equipment used: Rolling walker (2 wheeled) Transfers: Sit to/from Stand Sit to Stand: Min guard         General transfer comment: Cues for hand placement of advancement of LLE forward.  Cues for WBS and sequencing to maintain.    Ambulation/Gait Ambulation/Gait assistance: Min guard Ambulation Distance (Feet): 220 Feet Assistive device: Rolling walker (2 wheeled) Gait Pattern/deviations: Step-to pattern;Antalgic;Trunk flexed     General Gait Details: Cues for gait sequence and 50%PWB; cues also to activate quad for stance stability.  Remains to require min guard for safety.     Stairs            Wheelchair Mobility    Modified  Rankin (Stroke Patients Only)       Balance Overall balance assessment: Needs assistance   Sitting balance-Leahy Scale: Good       Standing balance-Leahy Scale: Fair                      Cognition Arousal/Alertness: Awake/alert Behavior During Therapy: WFL for tasks assessed/performed Overall Cognitive Status: Within Functional Limits for tasks assessed                      Exercises Total Joint Exercises Ankle Circles/Pumps: AROM;Both;10 reps;Supine Quad Sets: AROM;Left;10 reps;Supine Heel Slides: AAROM;Left;10 reps;Supine Hip ABduction/ADduction: AAROM;Left;10 reps;Supine Straight Leg Raises: AAROM;Left;10 reps;Supine Knee Flexion: AROM;Left;5 reps (with 10 sec hold) Goniometric ROM: 4-82 degrees.      General Comments        Pertinent Vitals/Pain Pain Assessment: 0-10 Pain Score: 6  Pain Location: L knee Pain Descriptors / Indicators: Discomfort;Grimacing;Guarding Pain Intervention(s): Monitored during session;Repositioned;Ice applied    Home Living                      Prior Function            PT Goals (current goals can now be found in the care plan section) Acute Rehab PT Goals Patient Stated Goal: would like to get back to traveling Potential to Achieve Goals: Good Progress towards PT goals: Progressing toward goals    Frequency  BID    PT Plan Current plan remains appropriate    Co-evaluation  End of Session Equipment Utilized During Treatment: Gait belt Activity Tolerance: Patient tolerated treatment well Patient left: in chair;with call bell/phone within reach;with family/visitor present     Time: [id number] PT Time Calculation (min) (ACUTE ONLY): 38 min  Charges:  $Gait Training: 23-37 mins $Therapeutic Exercise: 8-22 mins                    G Codes:      Cristela Blue 06/17/2015, 5:48 PM Governor Rooks, PTA pager [phone]

## 2015-06-12 NOTE — Clinical Social Work Placement (Signed)
   CLINICAL SOCIAL WORK PLACEMENT  NOTE  Date:  06/12/2015  Patient Details  Name: DESTANY BEAUMAN MRN: [id number] Date of Birth: 07/18/1940  Clinical Social Work is seeking post-discharge placement for this patient at the [place] level of care (*CSW will initial, date and re-position this form in  chart as items are completed):  Yes   Patient/family provided with [place] Work Department's list of facilities offering this level of care within the geographic area requested by the patient (or if unable, by the patient's family).  Yes   Patient/family informed of their freedom to choose among providers that offer the needed level of care, that participate in Medicare, Medicaid or managed care program needed by the patient, have an available bed and are willing to accept the patient.  Yes   Patient/family informed of [place]'s ownership interest in [facility] and [facility], as well as of the fact that they are under no obligation to receive care at these facilities.  PASRR submitted to EDS on 06/12/15     PASRR number received on 06/12/15     Existing PASRR number confirmed on       FL2 transmitted to all facilities in geographic area requested by pt/family on 06/12/15     FL2 transmitted to all facilities within larger geographic area on       Patient informed that his/her managed care company has contracts with or will negotiate with certain facilities, including the following:            Patient/family informed of bed offers received.  Patient chooses bed at       Physician recommends and patient chooses bed at      Patient to be transferred to   on  .  Patient to be transferred to facility by       Patient family notified on   of transfer.  Name of family member notified:        PHYSICIAN Please sign FL2     Additional Comment:    _______________________________________________ Caroline Sauger, LCSW 06/12/2015,  1:06 PM

## 2015-06-12 NOTE — Progress Notes (Signed)
Orthopedic Tech Progress Note Patient Details:  NETHANIA MESKIMEN 12-14-1940 [id number] Ortho visit put on cpm at 1900 Patient ID: Audree Bane, female   DOB: 01/26/1940, 75 y.o.   MRN: [id number]   Braulio Bosch 06/12/2015, 7:04 PM

## 2015-06-12 NOTE — Clinical Social Work Note (Signed)
Clinical Social Work Assessment  Patient Details  Name: Joanne Tran MRN: [id number] Date of Birth: 1940/01/23  Date of referral:  06/12/15               Reason for consult:  Facility Placement                Permission sought to share information with:  Chartered certified accountant granted to share information::  Yes, Verbal Permission Granted  Name::        Agency::  [facility]  Relationship::     Contact Information:     Housing/Transportation Living arrangements for the past 2 months:  [place] of Information:  Patient Patient Interpreter Needed:  None Criminal Activity/Legal Involvement Pertinent to Current Situation/Hospitalization:  No - Comment as needed Significant Relationships:  Adult Children (Son lives an hour away.) Lives with:  Self Do you feel safe going back to the place where you live?  Yes Need for family participation in patient care:  No (Coment) (Patient able to make own decisions.)  Care giving concerns:  Patient expressed no concerns at this time.   Social Worker assessment / plan:  LCSW received referral for possible SNF placement at time of discharge. LCSW met with patient at bedside to discuss discharge planning. Per patient, patient has pre-arranged SNF placement at [facility]. LCSW to continue to follow and assist with discharge planning needs.  Employment status:  Retired Forensic scientist:  Medicare PT Recommendations:  [place] / Referral to community resources:  [place]  Patient/Family's Response to care:  Patient understanding and agreeable to [employer] of care.  Patient/Family's Understanding of and Emotional Response to Diagnosis, Current Treatment, and Prognosis:  Patient understanding and agreeable to LCSW plan of care.  Emotional Assessment Appearance:  Appears stated age Attitude/Demeanor/Rapport:  Other (Appropriate.) Affect  (typically observed):  Accepting, Appropriate, Pleasant Orientation:  Oriented to Self, Oriented to Place, Oriented to  Time, Oriented to Situation Alcohol / Substance use:  Not Applicable Psych involvement (Current and /or in the community):  No (Comment) (Not appropriate on this admission.)  Discharge Needs  Concerns to be addressed:  No discharge needs identified Readmission within the last 30 days:  No Current discharge risk:  None Barriers to Discharge:  No Barriers Identified   Caroline Sauger, LCSW 06/12/2015, 1:05 PM [phone]

## 2015-06-12 NOTE — NC FL2 (Signed)
[place] LEVEL OF CARE SCREENING TOOL     IDENTIFICATION  Patient Name: Joanne Tran Birthdate: [birth year] Sex: female Admission Date (Current Location): 06/11/2015  [facility] and [place] Number:  Herbalist and Address:  [facility], [place] [address] [place], [place] [address]      Provider Number: [id number]  Attending Physician Name and Address:  Garald Balding, MD  Relative Name and Phone Number:       Current Level of Care: Hospital Recommended Level of Care: [place] Prior Approval Number:    Date Approved/Denied:   PASRR Number: [id number]  Discharge Plan: SNF    Current Diagnoses: Patient Active Problem List   Diagnosis Date Noted  . Primary osteoarthritis of left knee 06/11/2015  . S/P total knee replacement using cement 06/11/2015  . MITRAL VALVE PROLAPSE 11/16/2007  . HIATAL HERNIA 11/16/2007  . COUGH 11/16/2007  . THYROID CANCER, HX OF 11/16/2007    Orientation RESPIRATION BLADDER Height & Weight     Self, Time, Situation, Place  O2 Continent Weight: 201 lb 14.4 oz (91.581 kg) Height:  5\' 5"  (165.1 cm)  BEHAVIORAL SYMPTOMS/MOOD NEUROLOGICAL BOWEL NUTRITION STATUS      Continent Diet (Please see discharge summary.)  AMBULATORY STATUS COMMUNICATION OF NEEDS Skin   Limited Assist Verbally Surgical wounds                       Personal Care Assistance Level of Assistance  Bathing, Feeding, Dressing Bathing Assistance: Limited assistance Feeding assistance: Independent Dressing Assistance: Limited assistance     Functional Limitations Info             SPECIAL CARE FACTORS FREQUENCY  PT (By licensed PT), OT (By licensed OT)     PT Frequency: 5 OT Frequency: 5            Contractures      Additional Factors Info  Code Status, Allergies Code Status Info: Not on file Allergies Info: Codeine, Penicillins           Current Medications (06/12/2015):  This is  the current hospital active medication list Current Facility-Administered Medications  Medication Dose Route Frequency Provider Last Rate Last Dose  . acetaminophen (TYLENOL) tablet 650 mg  650 mg Oral Q6H Brian D Petrarca, PA-C      . alum & mag hydroxide-simeth (MAALOX/MYLANTA) 200-200-20 MG/5ML suspension 30 mL  30 mL Oral Q4H PRN Cherylann Ratel, PA-C      . atenolol (TENORMIN) tablet 50 mg  50 mg Oral Daily Cherylann Ratel, PA-C   50 mg at 06/12/15 [id number]  . bisacodyl (DULCOLAX) suppository 10 mg  10 mg Rectal Daily PRN Cherylann Ratel, PA-C      . colchicine tablet 0.6 mg  0.6 mg Oral BID Mike Craze Petrarca, PA-C   0.6 mg at 06/12/15 [id number]  . diphenhydrAMINE (BENADRYL) 12.5 MG/5ML elixir 12.5-25 mg  12.5-25 mg Oral Q4H PRN Cherylann Ratel, PA-C   25 mg at 06/12/15 0201  . docusate sodium (COLACE) capsule 100 mg  100 mg Oral BID Cherylann Ratel, PA-C   100 mg at 06/12/15 [id number]  . FLUoxetine (PROZAC) capsule 20 mg  20 mg Oral Daily Cherylann Ratel, PA-C   20 mg at 06/12/15 0806  . levothyroxine (SYNTHROID, LEVOTHROID) tablet 88 mcg  88 mcg Oral QAC breakfast Cherylann Ratel, PA-C   88 mcg at 06/12/15 [id number]  .  lisinopril (PRINIVIL,ZESTRIL) tablet 40 mg  40 mg Oral Daily Cherylann Ratel, PA-C   40 mg at 06/12/15 0806  . magnesium citrate solution 1 Bottle  1 Bottle Oral Once PRN Cherylann Ratel, PA-C      . menthol-cetylpyridinium (CEPACOL) lozenge 3 mg  1 lozenge Oral PRN Cherylann Ratel, PA-C       Or  . phenol (CHLORASEPTIC) mouth spray 1 spray  1 spray Mouth/Throat PRN Cherylann Ratel, PA-C      . methocarbamol (ROBAXIN) tablet 500 mg  500 mg Oral Q6H PRN Cherylann Ratel, PA-C   500 mg at 06/11/15 1411   Or  . methocarbamol (ROBAXIN) 500 mg in dextrose 5 % 50 mL IVPB  500 mg Intravenous Q6H PRN Cherylann Ratel, PA-C      . metoCLOPramide (REGLAN) tablet 5-10 mg  5-10 mg Oral Q8H PRN Cherylann Ratel, PA-C       Or  . metoCLOPramide (REGLAN) injection 5-10 mg  5-10 mg Intravenous Q8H  PRN Cherylann Ratel, PA-C      . ondansetron (ZOFRAN) tablet 4 mg  4 mg Oral Q6H PRN Cherylann Ratel, PA-C       Or  . ondansetron (ZOFRAN) injection 4 mg  4 mg Intravenous Q6H PRN Cherylann Ratel, PA-C      . oxyCODONE (Oxy IR/ROXICODONE) immediate release tablet 5-10 mg  5-10 mg Oral Q3H PRN Cherylann Ratel, PA-C   10 mg at 06/12/15 1210  . polyethylene glycol (MIRALAX / GLYCOLAX) packet 17 g  17 g Oral Daily PRN Cherylann Ratel, PA-C      . rivaroxaban (XARELTO) tablet 10 mg  10 mg Oral Q breakfast Cherylann Ratel, PA-C   10 mg at 06/12/15 0808     Discharge Medications: Please see discharge summary for a list of discharge medications.  Relevant Imaging Results:  Relevant Lab Results:   Additional Information SSN: [id number]  Caroline Sauger, LCSW

## 2015-06-12 NOTE — Discharge Instructions (Signed)
Information on my medicine - XARELTO (Rivaroxaban)  This medication education was reviewed with me or my healthcare representative as part of my discharge preparation.  The pharmacist that spoke with me during my hospital stay was:  Norva Riffle, RPH  Why was Xarelto prescribed for you? Xarelto was prescribed for you to reduce the risk of blood clots forming after orthopedic surgery. The medical term for these abnormal blood clots is venous thromboembolism (VTE).  What do you need to know about xarelto ? Take your Xarelto ONCE DAILY at the same time every day. You may take it either with or without food.  If you have difficulty swallowing the tablet whole, you may crush it and mix in applesauce just prior to taking your dose.  Take Xarelto exactly as prescribed by your doctor and DO NOT stop taking Xarelto without talking to the doctor who prescribed the medication.  Stopping without other VTE prevention medication to take the place of Xarelto may increase your risk of developing a clot.  After discharge, you should have regular check-up appointments with your healthcare provider that is prescribing your Xarelto.    What do you do if you miss a dose? If you miss a dose, take it as soon as you remember on the same day then continue your regularly scheduled once daily regimen the next day. Do not take two doses of Xarelto on the same day.   Important Safety Information A possible side effect of Xarelto is bleeding. You should call your healthcare provider right away if you experience any of the following: ? Bleeding from an injury or your nose that does not stop. ? Unusual colored urine (red or dark brown) or unusual colored stools (red or black). ? Unusual bruising for unknown reasons. ? A serious fall or if you hit your head (even if there is no bleeding).  Some medicines may interact with Xarelto and might increase your risk of bleeding while on Xarelto. To help avoid  this, consult your healthcare provider or pharmacist prior to using any new prescription or non-prescription medications, including herbals, vitamins, non-steroidal anti-inflammatory drugs (NSAIDs) and supplements.  This website has more information on Xarelto: [email or web address]

## 2015-06-12 NOTE — Anesthesia Postprocedure Evaluation (Signed)
Anesthesia Post Note  Patient: Joanne Tran  Procedure(s) Performed: Procedure(s) (LRB): LEFT TOTAL KNEE ARTHROPLASTY (Left)  Patient location during evaluation: PACU Anesthesia Type: General Level of consciousness: awake and alert and patient cooperative Pain management: pain level controlled Vital Signs Assessment: post-procedure vital signs reviewed and stable Respiratory status: spontaneous breathing and respiratory function stable Cardiovascular status: stable Anesthetic complications: no     Last Vitals:  Filed Vitals:   06/12/15 0030 06/12/15 0430  BP: 114/52 99/47  Pulse: 75 72  Temp: 36.9 C 36.6 C  Resp: 16 14    Last Pain:  Filed Vitals:   06/12/15 0704  PainSc: 5    Pain Goal: Patients Stated Pain Goal: 3 (06/12/15 0527)               Jerrianne Hartin S

## 2015-06-12 NOTE — Op Note (Signed)
NAMEMarland Kitchen  Joanne Tran, Joanne Tran NO.:  [id number]  MEDICAL RECORD NO.:  [id number]  LOCATION:  5N23C                        FACILITY:  [facility]  PHYSICIAN:  Vonna Kotyk. Karsen Nakanishi, M.D.DATE OF BIRTH:  09-13-1940  DATE OF PROCEDURE:  06/11/2015 DATE OF DISCHARGE:                              OPERATIVE REPORT   PREOPERATIVE DIAGNOSIS:  End-stage osteoarthritis, left knee.  POSTOPERATIVE DIAGNOSIS:  End-stage osteoarthritis, left knee.  PROCEDURE:  Left total knee replacement.  SURGEON:  Vonna Kotyk. Durward Fortes, M.D.  ASSISTANT:  Biagio Borg, PA-C.  ANESTHESIA:  General with adductor canal nerve block.  COMPLICATIONS:  None.  COMPONENTS:  DePuy LCS revision #3 tibial tray, a 10 mm polyethylene bridging bearing, a standard femoral component, a 3-peg metal-backed rotating patella.  All components were secured with polymethylmethacrylate.  DESCRIPTION OF PROCEDURE:  Ms. Marker was met with the family in the holding area.  I identified the left knee as appropriate operative site and marked it accordingly.  She did receive a preoperative adductor canal block per Anesthesia.  The patient was then transported to room #7 and placed under general anesthesia without difficulty.  Nursing staff inserted a Foley catheter.  Urine was clear.  Tourniquet was applied to the left thigh.  The left leg was then prepped with chlorhexidine scrub and DuraPrep x2 from the tourniquet to the tips of the toes.  Sterile draping was performed.  Time-out was called.  The left lower extremity was then elevated and Esmarch exsanguinated with a proximal tourniquet at 350 mmHg.  A midline longitudinal incision was made and centered from the superior pouch to tibial tubercle via sharp dissection.  Incision was carried down to subcutaneous tissue.  There was abundant adipose tissue that was incised at the level of the capsule.  The capsule was then incised in the medial parapatellar area with the Bovie.   Joint was entered.  There was a clear yellow joint effusion.  Patella was everted at 180 degrees and knee flexed to 90 degrees.  There were large osteophytes along the medial and lateral femoral condyle.  Diffuse beefy red synovitis.  A synovectomy was performed. The osteophytes were removed.  There was diffuse loss of articular cartilage, particularly along the medial compartment and to a lesser extent laterally.  We did measure #3 tibial femoral component.  First, bony cut was then made transversely in the proximal tibia with a 3 degree angle of declination.  We planned to use a revision tibial tray and thus decreased angle of declination.  We checked our bony cut with the external guide and felt we had perfect alignment.  Subsequent cuts were then made on the femur using the standard femoral jig.  We used a 4-degree distal femoral valgus cut.  Lamina spreaders were placed along the medial and lateral compartment to visualize the menisci, ACL, and PCL.  Menisci were removed with the Bovie as were the ACL and PCL.  Osteophytes were removed from the posterior femoral condyle using a curved 3/4 inch osteotome.  Final cut was then made on the femur for tapering purposes and obtained the center holes.  Retractors were then placed about the tibia, was advanced anteriorly. At that point, we  noticed that there had been an avulsion of a small piece of the tibia with the lateral capsule.  I sutured this in place, but still felt that there was a little bit of a gap with a #3 tibial tray anteriorly, where there was somewhat of exposed tibia.  This was closed with the suturing the capsule and the small piece of bone and also subsequently with methacrylate.  We felt a #3 was perfect fit.  We pinned this in place.  The center hole was then made.  The tibial jig was removed and we inserted the MBT revision tray.  The keel cut was then made and with perfect stability, we then applied a 10  mm polyethylene bridging bearing.  Flexion and extension gaps were perfectly symmetrical at 10 mm.  With each bony cut, we checked our external alignment on both the femur and the tibia.  The trial femoral component was then applied.  We reduced the entire construct.  We had excellent stability and excellent alignment with full extension and flexed well over 120 degrees without any malrotation of the tibial component.  The patella was prepared by removing approximately 9 mm of bone leaving about 12 mm of patellar thickness.  The patella jig was applied, 3 holes made.  The trial patella inserted and reduced and through a full range of motion was stable.  The trial components removed.  We copiously irrigated the joint with saline solution.  The final components were then impacted with polymethylmethacrylate.  We initially inserted the tibial tray followed by the 10-mm polyethylene bridging bearing and the femoral component. At each step, we removed extraneous methacrylate from the periphery of the components.  The patella was applied with methacrylate and a bone clamp.  At approximately 16 minutes, methacrylate had matured, during which time, we injected the joint with 0.25% Marcaine with epinephrine. Tourniquet was deflated at approximately 74 minutes.  We applied topical tranexamic acid, thought we had a nice dry field.  A medium-size Hemovac was inserted through the lateral capsule.  The joint was again irrigated.  The deep capsule was closed with a running 0 Ethibond, superficial capsule with a running 0 Vicryl, subcu with 3-0 Monocryl, skin closed with skin clips.  Sterile bulky dressing was applied followed by the patient's support stocking.  The patient did tolerate the procedure without complications.     Vonna Kotyk. Durward Fortes, M.D.    PWW/MEDQ  D:  06/11/2015  T:  06/12/2015  Job:  [id number]

## 2015-06-13 LAB — BASIC METABOLIC PANEL
ANION GAP: 5 (ref 5–15)
BUN: 18 mg/dL (ref 6–20)
CALCIUM: 7.6 mg/dL — AB (ref 8.9–10.3)
CHLORIDE: 105 mmol/L (ref 101–111)
CO2: 25 mmol/L (ref 22–32)
Creatinine, Ser: 1.49 mg/dL — ABNORMAL HIGH (ref 0.44–1.00)
GFR calc non Af Amer: 34 mL/min — ABNORMAL LOW (ref >60)
GFR, EST AFRICAN AMERICAN: 39 mL/min — AB (ref >60)
Glucose, Bld: 123 mg/dL — ABNORMAL HIGH (ref 65–99)
POTASSIUM: 3.4 mmol/L — AB (ref 3.5–5.1)
Sodium: 135 mmol/L (ref 135–145)

## 2015-06-13 LAB — CBC
HEMATOCRIT: 30.8 % — AB (ref 36.0–46.0)
HEMOGLOBIN: 9.6 g/dL — AB (ref 12.0–15.0)
MCH: 27.8 pg (ref 26.0–34.0)
MCHC: 31.2 g/dL (ref 30.0–36.0)
MCV: 89.3 fL (ref 78.0–100.0)
Platelets: 252 10*3/uL (ref 150–400)
RBC: 3.45 MIL/uL — AB (ref 3.87–5.11)
RDW: 13.9 % (ref 11.5–15.5)
WBC: 14.5 10*3/uL — AB (ref 4.0–10.5)

## 2015-06-13 MED ORDER — SODIUM CHLORIDE 0.9 % IV SOLN
INTRAVENOUS | Status: DC
  Administered 2015-06-13: 21:00:00 via INTRAVENOUS

## 2015-06-13 MED ORDER — ACETAMINOPHEN 325 MG PO TABS: 650.0000 mg | ORAL_TABLET | Freq: Four times a day (QID) | ORAL | Status: DC | PRN

## 2015-06-13 MED ORDER — METHOCARBAMOL 500 MG PO TABS: 500.0000 mg | ORAL_TABLET | Freq: Three times a day (TID) | ORAL | Status: DC | PRN

## 2015-06-13 MED ORDER — RIVAROXABAN 10 MG PO TABS: 10.0000 mg | ORAL_TABLET | Freq: Every day | ORAL | Status: DC

## 2015-06-13 MED ORDER — COLCHICINE 0.6 MG PO TABS: 0.6000 mg | ORAL_TABLET | Freq: Two times a day (BID) | ORAL | Status: DC | PRN

## 2015-06-13 MED ORDER — OXYCODONE HCL 5 MG PO TABS: 5.0000 mg | ORAL_TABLET | ORAL | Status: DC | PRN

## 2015-06-13 NOTE — Progress Notes (Signed)
Physical Therapy Treatment Patient Details Name: Joanne KaufmanDelores S Tran MRN: [id number] DOB: 03-26-1940 Today's Date: 06/13/2015    History of Present Illness Admitted for LTKA; PWB 50% LLE;  has a past medical history of Major depression (HCC); History of thyroid cancer (March 2009); Cervical spondylosis; Hiatal hernia; History of mitral valve prolapse; Essential hypertension; Cancer (HCC); Heart murmur; Anxiety; Spinal headache; and Gout.     PT Comments    Pt performed decreased fatigue secondary to fatigue.  Pt remains slow with gait sequencing which increases risk for fall.  Will continue to tx pt during acute hospitalization.    Follow Up Recommendations  SNF     Equipment Recommendations  Rolling walker with 5" wheels;3in1 (PT)    Recommendations for Other Services       Precautions / Restrictions Precautions Precautions: Knee Precaution Booklet Issued: Yes (comment) Restrictions Weight Bearing Restrictions: Yes LLE Weight Bearing: Partial weight bearing LLE Partial Weight Bearing Percentage or Pounds: 50    Mobility  Bed Mobility Overal bed mobility: Needs Assistance Bed Mobility: Sit to Supine       Sit to supine: Supervision   General bed mobility comments: flat bed with no rails.  Pt performed with cues for technique but did not require physical assistance.    Transfers Overall transfer level: Needs assistance Equipment used: Rolling walker (2 wheeled) Transfers: Sit to/from Stand Sit to Stand: Min guard         General transfer comment: Cues for hand placement of advancement of LLE forward.  Cues for WBS and sequencing to maintain.  Pt required x3 attempts before able to successfully stand.  Pt unsteady with movement this am.    Ambulation/Gait Ambulation/Gait assistance: Min guard Ambulation Distance (Feet): 160 Feet Assistive device: Rolling walker (2 wheeled) Gait Pattern/deviations: Step-to pattern;Antalgic;Trunk flexed Gait velocity: slow! Gait  velocity interpretation: Below normal speed for age/gender General Gait Details: Cues for gait sequence and 50%PWB; cues also to activate quad for stance stability.  Remains to require min guard for safety.  Pt performed decreased gait distance secondary to fatigue.     Stairs            Wheelchair Mobility    Modified Rankin (Stroke Patients Only)       Balance                                    Cognition Arousal/Alertness: Awake/alert Behavior During Therapy: WFL for tasks assessed/performed Overall Cognitive Status: Within Functional Limits for tasks assessed                      Exercises Total Joint Exercises Ankle Circles/Pumps: AROM;Both;10 reps;Supine Quad Sets: AROM;Left;10 reps;Supine Heel Slides: AAROM;Left;10 reps;Supine Hip ABduction/ADduction: AAROM;Left;10 reps;Supine Straight Leg Raises: AAROM;Left;10 reps;Supine Long Arc Quad: AROM;Left;10 reps;Seated Goniometric ROM: 4-82 degrees.      General Comments        Pertinent Vitals/Pain Pain Assessment: 0-10 Pain Score: 5  Pain Location: L knee Pain Descriptors / Indicators: Grimacing;Guarding;Tightness Pain Intervention(s): Monitored during session;Repositioned    Home Living                      Prior Function            PT Goals (current goals can now be found in the care plan section) Acute Rehab PT Goals Patient Stated Goal: would like to get back to  traveling Potential to Achieve Goals: Good Progress towards PT goals: Progressing toward goals    Frequency  BID    PT Plan Current plan remains appropriate    Co-evaluation             End of Session Equipment Utilized During Treatment: Gait belt Activity Tolerance: Patient tolerated treatment well Patient left: in chair;with call bell/phone within reach;with family/visitor present     Time: [id number] PT Time Calculation (min) (ACUTE ONLY): 35 min  Charges:  $Gait Training: 8-22  mins $Therapeutic Exercise: 8-22 mins                    G Codes:      Cristela Blue 2015/07/11, 9:51 AM Governor Rooks, PTA pager [phone]

## 2015-06-13 NOTE — Progress Notes (Signed)
Orthopedic Tech Progress Note Patient Details:  TAMSYN TRISSEL [birth year] [id number]  Patient ID: Audree Bane, female   DOB: December 30, 1940, 75 y.o.   MRN: [id number] Applied cpm  Karolee Stamps 06/13/2015, 7:11 AM

## 2015-06-13 NOTE — Progress Notes (Signed)
Patient ID: Joanne Tran, female   DOB: 21-Mar-1940, 76 y.o.   MRN: [id number] PATIENT ID: Joanne Tran        MRN:  [id number]          DOB/AGE: 01-25-1940 / 75 y.o.    Joni Fears, MD   Biagio Borg, PA-C [address] [place], [place]  [address]                             [phone]   PROGRESS NOTE  Subjective:  negative for Chest Pain  negative for Shortness of Breath  negative for Nausea/Vomiting   negative for Calf Pain    Tolerating Diet: yes         Patient reports pain as mild and moderate.       Objective: Vital signs in last 24 hours:   Patient Vitals for the past 24 hrs:  BP Temp Temp src Pulse Resp SpO2  06/13/15 1300 (!) 97/50 mmHg - - 86 18 100 %  06/13/15 0644 (!) 98/53 mmHg 97.7 F (36.5 C) Oral 88 16 98 %  06/12/15 2104 (!) 104/55 mmHg 97.9 F (36.6 C) Oral 88 15 98 %      Intake/Output from previous day:   06/07 0701 - 06/08 0700 In: 840 [P.O.:840] Out: 300 [Urine:150; Drains:150]   Intake/Output this shift:   06/08 0701 - 06/08 1900 In: 240 [P.O.:240] Out: 50 [Drains:50]   Intake/Output      06/07 0701 - 06/08 0700 06/08 0701 - 06/09 0700   P.O. 840 240   I.V. (mL/kg)     Total Intake(mL/kg) 840 (9.2) 240 (2.6)   Urine (mL/kg/hr) 150 (0.1) 0 (0)   Drains 150 (0.1) 50 (0.1)   Blood     Total Output 300 50   Net +540 +190        Urine Occurrence 3 x 2 x      LABORATORY DATA:  Recent Labs  06/12/15 0637 06/13/15 0350  WBC 10.2 14.5*  HGB 9.8* 9.6*  HCT 32.0* 30.8*  PLT 193 252    Recent Labs  06/12/15 0637 06/13/15 0350  NA 137 135  K 3.3* 3.4*  CL 103 105  CO2 23 25  BUN 15 18  CREATININE 1.12* 1.49*  GLUCOSE 117* 123*  CALCIUM 7.8* 7.6*   Lab Results  Component Value Date   INR 1.01 05/30/2015   INR 0.9 03/25/2007    Recent Radiographic Studies :  Dg Chest 2 View  05/30/2015  CLINICAL DATA:  Preop evaluation prior to total knee arthroplasty. EXAM: CHEST  2 VIEW COMPARISON:  11/14/2007.  FINDINGS: The heart remains normal in size. A large hiatal hernia is again demonstrated. Small amount of linear atelectasis or scarring at the left lung base. Stable possible bone island in the anterior aspect of the right second rib. Diffuse osteopenia. Mild bilateral shoulder degenerative changes. IMPRESSION: No acute abnormality.  Stable large hiatal hernia. Electronically Signed   By: Claudie Revering M.D.   On: 05/30/2015 13:54     Examination:  General appearance: alert, cooperative, mild distress and moderate distress  Wound Exam: clean, dry, intact   Drainage:  None: wound tissue dry  Motor Exam: EHL, FHL, Anterior Tibial and Posterior Tibial Intact  Sensory Exam: Superficial Peroneal, Deep Peroneal and Tibial normal  Vascular Exam: Left dorsalis pedis artery has 1+ (weak) pulse  Assessment:    2 Days Post-Op  Procedure(s) (  LRB): LEFT TOTAL KNEE ARTHROPLASTY (Left)  ADDITIONAL DIAGNOSIS:  Principal Problem:   Primary osteoarthritis of left knee Active Problems:   S/P total knee replacement using cement     Plan: Physical Therapy as ordered Partial Weight Bearing @ 50% (PWB)  DVT Prophylaxis:  Xarelto, Foot Pumps and TED hose  DISCHARGE PLAN: Skilled Nursing Facility/Rehab tomorrow  DISCHARGE NEEDS: CPM, Walker and 3-in-1 comode seat        Murat Rideout  06/13/2015 2:15 PM

## 2015-06-13 NOTE — Progress Notes (Signed)
Orthopedic Tech Progress Note Patient Details:  Joanne Tran [birth year] [id number]  CPM Left Knee CPM Left Knee: On Left Knee Flexion (Degrees): 90 Left Knee Extension (Degrees): 0   [name] 06/13/2015, 3:11 PM

## 2015-06-13 NOTE — Progress Notes (Signed)
Physical Therapy Treatment Patient Details Name: Joanne Tran MRN: [id number] DOB: 06-19-1940 Today's Date: 06/13/2015    History of Present Illness Admitted for LTKA; PWB 50% LLE;  has a past medical history of Major depression ([place]); History of thyroid cancer (March 2009); Cervical spondylosis; Hiatal hernia; History of mitral valve prolapse; Essential hypertension; Cancer ([place]); Heart murmur; Anxiety; Spinal headache; and Gout.     PT Comments    Pt progressing well.  Will continue PT intervention during hospital stay.    Follow Up Recommendations  SNF     Equipment Recommendations  Rolling walker with 5" wheels;3in1 (PT)    Recommendations for Other Services       Precautions / Restrictions Precautions Precautions: Knee Precaution Booklet Issued: Yes (comment) Restrictions Weight Bearing Restrictions: Yes LLE Weight Bearing: Partial weight bearing LLE Partial Weight Bearing Percentage or Pounds: 50    Mobility  Bed Mobility Overal bed mobility: Needs Assistance Bed Mobility: Supine to Sit     Supine to sit: Supervision;HOB elevated (with rails) Sit to supine: Supervision   General bed mobility comments: Cues for sequencing and upper trunk transition.    Transfers Overall transfer level: Needs assistance Equipment used: Rolling walker (2 wheeled) Transfers: Sit to/from Stand Sit to Stand: Min guard         General transfer comment: Cues for hand placement of advancement of LLE forward.  Cues for WBS and sequencing to maintain.  Pt remains to reach for RW and requires cues to push from seated surface.    Ambulation/Gait Ambulation/Gait assistance: Min guard Ambulation Distance (Feet): 210 Feet Assistive device: Rolling walker (2 wheeled) Gait Pattern/deviations: Step-to pattern;Antalgic;Decreased stride length Gait velocity: slow! Gait velocity interpretation: Below normal speed for age/gender General Gait Details: Cues for gait sequence and 50%PWB;  cues also to activate quad for stance stability.  Remains to require min guard for safety.  Pt performed decreased gait distance secondary to fatigue.  Pt attempting step through and educated to maintain step to sequencing to maintain weight bearing status.     Stairs            Wheelchair Mobility    Modified Rankin (Stroke Patients Only)       Balance                                    Cognition Arousal/Alertness: Awake/alert Behavior During Therapy: WFL for tasks assessed/performed Overall Cognitive Status: Within Functional Limits for tasks assessed                      Exercises Total Joint Exercises Ankle Circles/Pumps: AROM;Both;10 reps;Supine Quad Sets: AROM;Left;10 reps;Supine Heel Slides: AAROM;Left;10 reps;Supine Hip ABduction/ADduction: AAROM;Left;10 reps;Supine Straight Leg Raises: AAROM;Left;10 reps;Supine Long Arc Quad: AROM;Left;10 reps;Seated Goniometric ROM: 4-82 degrees.      General Comments        Pertinent Vitals/Pain Pain Assessment: 0-10 Pain Score: 6  Pain Location: L knee Pain Descriptors / Indicators: Grimacing;Guarding;Tightness Pain Intervention(s): Monitored during session;Repositioned    Home Living                      Prior Function            PT Goals (current goals can now be found in the care plan section) Acute Rehab PT Goals Patient Stated Goal: would like to get back to traveling Potential to Achieve Goals: Good Progress  towards PT goals: Progressing toward goals    Frequency  BID    PT Plan Current plan remains appropriate    Co-evaluation             End of Session Equipment Utilized During Treatment: Gait belt Activity Tolerance: Patient tolerated treatment well Patient left: in chair;with call bell/phone within reach;with family/visitor present (sitting on toliet waiting to bathe with CNA, informed patient to pull string if she needs assistance.  )     Time:  [id number] PT Time Calculation (min) (ACUTE ONLY): 31 min  Charges:  $Gait Training: 8-22 mins $Therapeutic Exercise: 8-22 mins                    G Codes:      Cristela Blue 25-Jun-2015, 1:12 PM  Governor Rooks, PTA pager [phone]

## 2015-06-13 NOTE — Discharge Summary (Signed)
Joni Fears, MD   Biagio Borg, PA-C [address] [place], [place]  [address]                             [phone]  PATIENT ID: Joanne Tran        MRN:  [id number]          DOB/AGE: December 20, 1940 / 75 y.o.    DISCHARGE SUMMARY  ADMISSION DATE:    06/11/2015 DISCHARGE DATE:   06/14/2015   ADMISSION DIAGNOSIS: LEFT KNEE END STAGE OSTEOARTHRITIS    DISCHARGE DIAGNOSIS:  LEFT KNEE END STAGE OSTEOARTHRITIS    ADDITIONAL DIAGNOSIS: Principal Problem:   Primary osteoarthritis of left knee Active Problems:   S/P total knee replacement using cement  Past Medical History  Diagnosis Date  . Major depression ([place])   . History of thyroid cancer March 2009  . Cervical spondylosis   . Hiatal hernia   . History of mitral valve prolapse   . Essential hypertension   . Cancer ([place])     thyroid  . Heart murmur     mitral valve prolapse with murmur  . Anxiety     uses prozac  . Spinal headache     spinal headaches about 25 years ago  . Gout     PROCEDURE: Procedure(s): LEFT TOTAL KNEE ARTHROPLASTY  on 06/11/2015  CONSULTS: none     HISTORY: Joanne Tran is a very pleasant 75 year old white female who is seen today for evaluation of her left knee. She has had chronic problems with her left knee for at least a year. She had been noted to have osteoarthritis this dating back to late 2013. At that time, she had started having problems with the knee and had corticosteroid injections started actually in both knees over the ensuing years. She had most recently had a corticosteroid injection to the left knee on 04/10/2015. She has tried corticosteroid injections and is now continuing to have increasing pain and discomfort in the knee. She now has nighttime pain noted. She has pain with ambulation as well as activities of daily living. She has gotten to the point now where she cannot tolerate this pain  HOSPITAL COURSE:  Joanne Tran is a 75 y.o. admitted on 06/11/2015 and  found to have a diagnosis of [place].  After appropriate laboratory studies were obtained  they were taken to the operating room on 06/11/2015 and underwent  Procedure(s): LEFT TOTAL KNEE ARTHROPLASTY  .   They were given perioperative antibiotics:  Anti-infectives    Start     Dose/Rate Route Frequency Ordered Stop   06/11/15 1800  vancomycin (VANCOCIN) IVPB 1000 mg/200 mL premix     1,000 mg 200 mL/hr over 60 Minutes Intravenous Every 12 hours 06/11/15 0956 06/11/15 1759   06/11/15 0700  vancomycin (VANCOCIN) IVPB 1000 mg/200 mL premix     1,000 mg 200 mL/hr over 60 Minutes Intravenous To ShortStay Surgical 06/10/15 1301 06/11/15 0724    .  Tolerated the procedure well.  Placed with a foley intraoperatively.     Toradol was given post op.  POD #1, allowed out of bed to a chair.  PT for ambulation and exercise program.  Foley D/C'd in morning.  IV saline locked.  O2 discontionued.  POD #2, continued PT and ambulation.   Hemovac pulled.   POD #3, continued PT and ambulation.  KCL 40 meq given.  GFR improved  with IV NSS  The remainder of the hospital course was dedicated to ambulation and strengthening.   The patient was discharged on 3 Days Post-Op in  Stable condition.  Blood products given:none  DIAGNOSTIC STUDIES: Recent vital signs:  Patient Vitals for the past 24 hrs:  BP Temp Temp src Pulse Resp SpO2  06/14/15 0525 (!) 101/47 mmHg 98 F (36.7 C) Oral 70 18 97 %  06/13/15 1949 (!) 103/48 mmHg 98.5 F (36.9 C) Oral 77 18 97 %  06/13/15 1300 (!) 97/50 mmHg - - 86 18 100 %       Recent laboratory studies:  Recent Labs  06/12/15 0637 06/13/15 0350 06/14/15 0406  WBC 10.2 14.5* 10.2  HGB 9.8* 9.6* 8.3*  HCT 32.0* 30.8* 26.7*  PLT 193 252 232    Recent Labs  06/12/15 0637 06/13/15 0350 06/14/15 0406  NA 137 135 140  K 3.3* 3.4* 3.2*  CL 103 105 111  CO2 23 25 23   BUN 15 18 20   CREATININE 1.12* 1.49* 1.29*  GLUCOSE 117* 123* 105*    CALCIUM 7.8* 7.6* 7.4*   Lab Results  Component Value Date   INR 1.01 05/30/2015   INR 0.9 03/25/2007     Recent Radiographic Studies :  Dg Chest 2 View  05/30/2015  CLINICAL DATA:  Preop evaluation prior to total knee arthroplasty. EXAM: CHEST  2 VIEW COMPARISON:  11/14/2007. FINDINGS: The heart remains normal in size. A large hiatal hernia is again demonstrated. Small amount of linear atelectasis or scarring at the left lung base. Stable possible bone island in the anterior aspect of the right second rib. Diffuse osteopenia. Mild bilateral shoulder degenerative changes. IMPRESSION: No acute abnormality.  Stable large hiatal hernia. Electronically Signed   By: Claudie Revering M.D.   On: 05/30/2015 13:54    DISCHARGE INSTRUCTIONS:     Discharge Instructions    CPM    Complete by:  As directed   Continuous passive motion machine (CPM):      Use the CPM from 0 to 60 degrees for 6-8 hours per day.      You may increase by 5-10 per day.  You may break it up into 2 or 3 sessions per day.      Use CPM for 3-4 weeks or until you are told to stop.     Call MD / Call 911    Complete by:  As directed   If you experience chest pain or shortness of breath, CALL 911 and be transported to the hospital emergency room.  If you develope a fever above 101 F, pus (white drainage) or increased drainage or redness at the wound, or calf pain, call your surgeon's office.     Change dressing    Complete by:  As directed   DO NOT CHANGE YOUR DRESSING.     Constipation Prevention    Complete by:  As directed   Drink plenty of fluids.  Prune juice may be helpful.  You may use a stool softener, such as Colace (over the counter) 100 mg twice a day.  Use MiraLax (over the counter) for constipation as needed.     Diet general    Complete by:  As directed      Discharge instructions    Complete by:  As directed   [place] items at home which could result in a fall. This  includes throw rugs or furniture in walking pathways ICE  to the affected joint every three hours while awake for 30 minutes at a time, for at least the first 3-5 days, and then as needed for pain and swelling.  Continue to use ice for pain and swelling. You may notice swelling that will progress down to the foot and ankle.  This is normal after surgery.  Elevate your leg when you are not up walking on it.   Continue to use the breathing machine you got in the hospital (incentive spirometer) which will help keep your temperature down.  It is common for your temperature to cycle up and down following surgery, especially at night when you are not up moving around and exerting yourself.  The breathing machine keeps your lungs expanded and your temperature down.   DIET:  As you were doing prior to hospitalization, we recommend a well-balanced diet.  DRESSING / WOUND CARE / SHOWERING  Keep the surgical dressing until follow up.  The dressing is water proof, so you can shower without any extra covering.  IF THE DRESSING FALLS OFF or the wound gets wet inside, change the dressing with sterile gauze.  Please use good hand washing techniques before changing the dressing.  Do not use any lotions or creams on the incision until instructed by your surgeon.    ACTIVITY  Increase activity slowly as tolerated, but follow the weight bearing instructions below.   No driving for 6 weeks or until further direction given by your physician.  You cannot drive while taking narcotics.  No lifting or carrying greater than 10 lbs. until further directed by your surgeon. Avoid periods of inactivity such as sitting longer than an hour when not asleep. This helps prevent blood clots.  You may return to work once you are authorized by your doctor.     WEIGHT BEARING   Partial weight bearing with assist device as directed.  50% weight bearing as taught in PT   EXERCISES  Results after joint replacement surgery are often  greatly improved when you follow the exercise, range of motion and muscle strengthening exercises prescribed by your doctor. Safety measures are also important to protect the joint from further injury. Any time any of these exercises cause you to have increased pain or swelling, decrease what you are doing until you are comfortable again and then slowly increase them. If you have problems or questions, call your caregiver or physical therapist for advice.   Rehabilitation is important following a joint replacement. After just a few days of immobilization, the muscles of the leg can become weakened and shrink (atrophy).  These exercises are designed to build up the tone and strength of the thigh and leg muscles and to improve motion. Often times heat used for twenty to thirty minutes before working out will loosen up your tissues and help with improving the range of motion but do not use heat for the first two weeks following surgery (sometimes heat can increase post-operative swelling).   These exercises can be done on a training (exercise) mat,  on a table or on a bed. Use whatever works the best and is most comfortable for you.    Use music or television while you are exercising so that the exercises are a pleasant break in your day. This will make your life better with the exercises acting as a break in your routine that you can look forward to.   Perform all exercises about fifteen times, three times per day or as directed.  You  should exercise both the operative leg and the other leg as well.   Exercises include:  Quad Sets - Tighten up the muscle on the front of the thigh (Quad) and hold for 5-10 seconds.   Straight Leg Raises - With your knee straight (if you were given a brace, keep it on), lift the leg to 60 degrees, hold for 3 seconds, and slowly lower the leg.  Perform this exercise against resistance later as your leg gets stronger.  Leg Slides: Lying on your back, slowly slide your foot toward  your buttocks, bending your knee up off the floor (only go as far as is comfortable). Then slowly slide your foot back down until your leg is flat on the floor again.  Angel Wings: Lying on your back spread your legs to the side as far apart as you can without causing discomfort.  Hamstring Strength:  Lying on your back, push your heel against the floor with your leg straight by tightening up the muscles of your buttocks.  Repeat, but this time bend your knee to a comfortable angle, and push your heel against the floor.  You may put a pillow under the heel to make it more comfortable if necessary.   A rehabilitation program following joint replacement surgery can speed recovery and prevent re-injury in the future due to weakened muscles. Contact your doctor or a physical therapist for more information on knee rehabilitation.    CONSTIPATION  Constipation is defined medically as fewer than three stools per week and severe constipation as less than one stool per week.  Even if you have a regular bowel pattern at home, your normal regimen is likely to be disrupted due to multiple reasons following surgery.  Combination of anesthesia, postoperative narcotics, change in appetite and fluid intake all can affect your bowels.   YOU MUST use at least one of the following options; they are listed in order of increasing strength to get the job done.  They are all available over the counter, and you may need to use some, POSSIBLY even all of these options:    Drink plenty of fluids (prune juice may be helpful) and high fiber foods Colace 100 mg by mouth twice a day  Senokot for constipation as directed and as needed Dulcolax (bisacodyl), take with full glass of water  Miralax (polyethylene glycol) once or twice a day as needed.  If you have tried all these things and are unable to have a bowel movement in the first 3-4 days after surgery call either your surgeon or your primary doctor.    If you experience  loose stools or diarrhea, hold the medications until you stool forms back up.  If your symptoms do not get better within 1 week or if they get worse, check with your doctor.  If you experience "the worst abdominal pain ever" or develop nausea or vomiting, please contact the office immediately for further recommendations for treatment.   ITCHING:  If you experience itching with your medications, try taking only a single pain pill, or even half a pain pill at a time.  You can also use Benadryl over the counter for itching or also to help with sleep.   TED HOSE STOCKINGS:  Use stockings on both legs until for at least 2 weeks or as directed by physician office. They may be removed at night for sleeping.  MEDICATIONS:  See your medication summary on the "After Visit Summary" that nursing will review with you.  You may have some home medications which will be placed on hold until you complete the course of blood thinner medication.  It is important for you to complete the blood thinner medication as prescribed.  PRECAUTIONS:  If you experience chest pain or shortness of breath - call 911 immediately for transfer to the hospital emergency department.   If you develop a fever greater that 101 F, purulent drainage from wound, increased redness or drainage from wound, foul odor from the wound/dressing, or calf pain - CONTACT YOUR SURGEON.                                                   FOLLOW-UP APPOINTMENTS:  If you do not already have a post-op appointment, please call the office for an appointment to be seen by your surgeon.  Guidelines for how soon to be seen are listed in your "After Visit Summary", but are typically between 1-4 weeks after surgery.  OTHER INSTRUCTIONS:   Knee Replacement:  Do not place pillow under knee, focus on keeping the knee straight while resting. CPM instructions: 0-90 degrees, 2 hours in the morning, 2 hours in the afternoon, and 2 hours in the evening. Place foam block,  curve side up under heel at all times except when in CPM or when walking.  DO NOT modify, tear, cut, or change the foam block in any way.  MAKE SURE YOU:  Understand these instructions.  Get help right away if you are not doing well or get worse.    Thank you for letting us be a part of your medical care team.  It is a privilege we respect greatly.  We hope these instructions will help you stay on track for a fast and full recovery!     Do not put a pillow under the knee. Place it under the heel.    Complete by:  As directed      Driving restrictions    Complete by:  As directed   No driving for 6 weeks     Increase activity slowly as tolerated    Complete by:  As directed      Lifting restrictions    Complete by:  As directed   No lifting for 6 weeks     Partial weight bearing    Complete by:  As directed   % Body Weight:  50%  Laterality:  left  Extremity:  Lower     Patient may shower    Complete by:  As directed   You may shower over your dressing     TED hose    Complete by:  As directed   Use stockings (TED hose) for 3 weeks on left  leg.  You may remove them at night for sleeping.           DISCHARGE MEDICATIONS:     Medication List    STOP taking these medications        aspirin EC 81 MG tablet     naproxen sodium 220 MG tablet  Commonly known as:  ANAPROX      TAKE these medications        acetaminophen 325 MG tablet  Commonly known as:  TYLENOL  Take 2 tablets (650 mg total) by mouth every 6 (six) hours as needed.     atenolol 50  MG tablet  Commonly known as:  TENORMIN  Take 50 mg by mouth daily.     colchicine 0.6 MG tablet  Take 1 tablet (0.6 mg total) by mouth 2 (two) times daily as needed (FOR GOUT ATTACK).     hydrochlorothiazide 25 MG tablet  Commonly known as:  HYDRODIURIL  Take 25 mg by mouth daily.     levothyroxine 88 MCG tablet  Commonly known as:  SYNTHROID, LEVOTHROID  Take 88 mcg by mouth daily before breakfast.     lisinopril  40 MG tablet  Commonly known as:  PRINIVIL,ZESTRIL  Take 40 mg by mouth daily.     methocarbamol 500 MG tablet  Commonly known as:  ROBAXIN  Take 1 tablet (500 mg total) by mouth every 8 (eight) hours as needed for muscle spasms.     oxyCODONE 5 MG immediate release tablet  Commonly known as:  Oxy IR/ROXICODONE  Take 1-2 tablets (5-10 mg total) by mouth every 4 (four) hours as needed for moderate pain, severe pain or breakthrough pain.     PROZAC 20 MG capsule  Generic drug:  FLUoxetine  Take 20 mg by mouth daily.     rivaroxaban 10 MG Tabs tablet  Commonly known as:  XARELTO  Take 1 tablet (10 mg total) by mouth daily with breakfast.        FOLLOW UP VISIT:   Follow-up Information    Follow up with Garald Balding, MD On 06/26/2015.   Specialty:  Orthopedic Surgery   Contact information:   [address] [address] [phone]       DISPOSITION:   Skilled Nursing Facility/Rehab  CONDITION:  Stable   Mike Craze. [place], [place] [phone]  06/14/2015 8:37 AM

## 2015-06-13 NOTE — Progress Notes (Signed)
Orthopedic Tech Progress Note Patient Details:  Joanne Tran Aug 29, 1940 [id number]  CPM Left Knee CPM Left Knee: On Left Knee Flexion (Degrees): 90 Left Knee Extension (Degrees): 0   [name] 06/13/2015, 6:34 PM

## 2015-06-14 DIAGNOSIS — R262 Difficulty in walking, not elsewhere classified: Secondary | ICD-10-CM | POA: Diagnosis not present

## 2015-06-14 DIAGNOSIS — K458 Other specified abdominal hernia without obstruction or gangrene: Secondary | ICD-10-CM | POA: Diagnosis not present

## 2015-06-14 DIAGNOSIS — Z96652 Presence of left artificial knee joint: Secondary | ICD-10-CM | POA: Diagnosis not present

## 2015-06-14 DIAGNOSIS — M109 Gout, unspecified: Secondary | ICD-10-CM | POA: Diagnosis not present

## 2015-06-14 DIAGNOSIS — F419 Anxiety disorder, unspecified: Secondary | ICD-10-CM | POA: Diagnosis not present

## 2015-06-14 DIAGNOSIS — F329 Major depressive disorder, single episode, unspecified: Secondary | ICD-10-CM | POA: Diagnosis not present

## 2015-06-14 DIAGNOSIS — M47892 Other spondylosis, cervical region: Secondary | ICD-10-CM | POA: Diagnosis not present

## 2015-06-14 DIAGNOSIS — F330 Major depressive disorder, recurrent, mild: Secondary | ICD-10-CM | POA: Diagnosis not present

## 2015-06-14 DIAGNOSIS — I10 Essential (primary) hypertension: Secondary | ICD-10-CM | POA: Diagnosis not present

## 2015-06-14 DIAGNOSIS — M25562 Pain in left knee: Secondary | ICD-10-CM | POA: Diagnosis not present

## 2015-06-14 DIAGNOSIS — Z471 Aftercare following joint replacement surgery: Secondary | ICD-10-CM | POA: Diagnosis not present

## 2015-06-14 DIAGNOSIS — M6281 Muscle weakness (generalized): Secondary | ICD-10-CM | POA: Diagnosis not present

## 2015-06-14 DIAGNOSIS — K449 Diaphragmatic hernia without obstruction or gangrene: Secondary | ICD-10-CM | POA: Diagnosis not present

## 2015-06-14 LAB — BASIC METABOLIC PANEL
ANION GAP: 6 (ref 5–15)
BUN: 20 mg/dL (ref 6–20)
CALCIUM: 7.4 mg/dL — AB (ref 8.9–10.3)
CO2: 23 mmol/L (ref 22–32)
Chloride: 111 mmol/L (ref 101–111)
Creatinine, Ser: 1.29 mg/dL — ABNORMAL HIGH (ref 0.44–1.00)
GFR calc non Af Amer: 40 mL/min — ABNORMAL LOW (ref >60)
GFR, EST AFRICAN AMERICAN: 47 mL/min — AB (ref >60)
GLUCOSE: 105 mg/dL — AB (ref 65–99)
POTASSIUM: 3.2 mmol/L — AB (ref 3.5–5.1)
Sodium: 140 mmol/L (ref 135–145)

## 2015-06-14 LAB — CBC
HEMATOCRIT: 26.7 % — AB (ref 36.0–46.0)
HEMOGLOBIN: 8.3 g/dL — AB (ref 12.0–15.0)
MCH: 27.7 pg (ref 26.0–34.0)
MCHC: 31.1 g/dL (ref 30.0–36.0)
MCV: 89 fL (ref 78.0–100.0)
Platelets: 232 10*3/uL (ref 150–400)
RBC: 3 MIL/uL — AB (ref 3.87–5.11)
RDW: 14.2 % (ref 11.5–15.5)
WBC: 10.2 10*3/uL (ref 4.0–10.5)

## 2015-06-14 MED ORDER — POTASSIUM CHLORIDE CRYS ER 20 MEQ PO TBCR
40.0000 meq | EXTENDED_RELEASE_TABLET | Freq: Once | ORAL | Status: AC
  Administered 2015-06-14: 40 meq via ORAL
  Filled 2015-06-14: qty 2

## 2015-06-14 NOTE — Progress Notes (Signed)
Orthopedic Tech Progress Note Patient Details:  Joanne Tran 09-16-1940 [id number]  Patient ID: Joanne Tran, female   DOB: 04/29/1940, 75 y.o.   MRN: [id number] Applied cpm 0-75  Karolee Stamps 06/14/2015, 5:56 AM

## 2015-06-14 NOTE — Progress Notes (Signed)
Pt discharge education and instructions completed. Pt discharge to [facility] SNF with son to transport her to disposition. Pt IV removed; Left knee incision dsg changed due to old drainage on dsg. dsg clean, dry and intact with no active bleeding, drainage or discharge noted. Pt TEDs remain on BLE; pt pre-medicated with her pain med prior to discharge. Pt son handed pt prescriptions for tylenol, colchicine, robaxin, oxycodone and xarelto. Pt transported off unit via wheelchair with belongings and son at side. Francis Gaines Nikaya Nasby RN.

## 2015-06-14 NOTE — Clinical Social Work Note (Signed)
Patient will discharge today per MD order. Patient will discharge to: [facility] RN to call report prior to transportation to: [phone] Transportation: Son, [place]- SNF is ready to transport when son arrives and RN is ready  CSW sent discharge summary to SNF for review.    Nonnie Done, LCSW [phone]  5N1-9, 2S 15-16 and Psychiatric Service Line  Licensed Clinical Social Worker

## 2015-06-14 NOTE — Progress Notes (Signed)
Physical Therapy Treatment Patient Details Name: Joanne Tran MRN: [id number] DOB: [birth year] Today's Date: 06/14/2015    History of Present Illness Admitted for LTKA; PWB 50% LLE;  has a past medical history of Major depression ([place]); History of thyroid cancer (March 2009); Cervical spondylosis; Hiatal hernia; History of mitral valve prolapse; Essential hypertension; Cancer ([place]); Heart murmur; Anxiety; Spinal headache; and Gout.     PT Comments    Pt making gradual progress, anticipate D/C to SNF following acute care stay.   Follow Up Recommendations  SNF     Equipment Recommendations  Rolling walker with 5" wheels;3in1 (PT)    Recommendations for Other Services       Precautions / Restrictions Precautions Precautions: Knee Restrictions Weight Bearing Restrictions: Yes LLE Weight Bearing: Partial weight bearing LLE Partial Weight Bearing Percentage or Pounds: 50    Mobility  Bed Mobility               General bed mobility comments: pt reports just returning to bed, declines getting up and ambulating  Transfers                    Ambulation/Gait                 Stairs            Wheelchair Mobility    Modified Rankin (Stroke Patients Only)       Balance                                    Cognition Arousal/Alertness: Awake/alert Behavior During Therapy: WFL for tasks assessed/performed Overall Cognitive Status: Within Functional Limits for tasks assessed                      Exercises Total Joint Exercises Ankle Circles/Pumps: AROM;Both;10 reps;Supine Quad Sets: AROM;Left;10 reps;Supine Short Arc Quad: Strengthening;Left;10 reps Heel Slides: AAROM;Left;10 reps;Supine Hip ABduction/ADduction: AAROM;Left;10 reps;Supine Straight Leg Raises: AAROM;Left;10 reps;Supine Goniometric ROM: knee flexion 85 degrees    General Comments        Pertinent Vitals/Pain Pain Assessment: 0-10 Pain Score: 6   Pain Location: Lt knee Pain Descriptors / Indicators: Aching Pain Intervention(s): Monitored during session    Home Living                      Prior Function            PT Goals (current goals can now be found in the care plan section) Acute Rehab PT Goals Patient Stated Goal: walk and be active again PT Goal Formulation: With patient Time For Goal Achievement: 06/18/15 Potential to Achieve Goals: Good Progress towards PT goals: Progressing toward goals    Frequency  7X/week    PT Plan Frequency needs to be updated    Co-evaluation             End of Session   Activity Tolerance: Patient tolerated treatment well Patient left: in bed;with call bell/phone within reach;with family/visitor present     Time: [id number] PT Time Calculation (min) (ACUTE ONLY): 13 min  Charges:  $Therapeutic Exercise: 8-22 mins                    G Codes:      Cassell Clement, PT, CSCS Pager [phone] Office [phone]  06/14/2015, 1:17 PM

## 2015-06-14 NOTE — Care Management Important Message (Signed)
Important Message  Patient Details  Name: Joanne Tran MRN: [id number] Date of Birth: 03-10-1940   Medicare Important Message Given:  Yes    Nazario Russom Abena 06/14/2015, 11:14 AM

## 2015-06-14 NOTE — Clinical Social Work Placement (Signed)
   CLINICAL SOCIAL WORK PLACEMENT  NOTE  Date:  06/14/2015  Patient Details  Name: Joanne Tran MRN: [id number] Date of Birth: [birth year]  Clinical Social Work is seeking post-discharge placement for this patient at the [place] level of care (*CSW will initial, date and re-position this form in  chart as items are completed):  Yes   Patient/family provided with [facility] [place] Work Department's list of facilities offering this level of care within the geographic area requested by the patient (or if unable, by the patient's family).  Yes   Patient/family informed of their freedom to choose among providers that offer the needed level of care, that participate in Medicare, Medicaid or managed care program needed by the patient, have an available bed and are willing to accept the patient.  Yes   Patient/family informed of [facility]'s ownership interest in [facility] and [facility], as well as of the fact that they are under no obligation to receive care at these facilities.  PASRR submitted to EDS on 06/12/15     PASRR number received on 06/12/15     Existing PASRR number confirmed on       FL2 transmitted to all facilities in geographic area requested by pt/family on 06/12/15     FL2 transmitted to all facilities within larger geographic area on       Patient informed that his/her managed care company has contracts with or will negotiate with certain facilities, including the following:        Yes   Patient/family informed of bed offers received.  Patient chooses bed at [facility]     Physician recommends and patient chooses bed at      Patient to be transferred to [facility] on 06/14/15.  Patient to be transferred to facility by son [place]     Patient family notified on 06/14/15 of transfer.  Name of family member notified:  son [place]     PHYSICIAN Please sign FL2     Additional Comment:     _______________________________________________ Dulcy Fanny, LCSW 06/14/2015, 10:25 AM

## 2015-06-14 NOTE — Progress Notes (Signed)
Report given to Elizabeth-Nurse at [facility] SNF-where patient is discharging to-No further questions at this time.

## 2015-06-14 NOTE — Progress Notes (Signed)
Patient ID: Joanne Tran, female   DOB: [birth year], 75 y.o.   MRN: [id number] PATIENT ID: CALLEE DRUSCHEL        MRN:  [id number]          DOB/AGE: 05-Oct-1940 / 75 y.o.    Joni Fears, MD   Biagio Borg, PA-C [address] [place], [place]  [address]                             [phone]   PROGRESS NOTE  Subjective:  negative for Chest Pain  negative for Shortness of Breath  negative for Nausea/Vomiting   negative for Calf Pain    Tolerating Diet: yes         Patient reports pain as mild.     Comfortable, ready for D/C to [facility] facility  Objective: Vital signs in last 24 hours:   Patient Vitals for the past 24 hrs:  BP Temp Temp src Pulse Resp SpO2  06/14/15 0525 (!) 101/47 mmHg 98 F (36.7 C) Oral 70 18 97 %  06/13/15 1949 (!) 103/48 mmHg 98.5 F (36.9 C) Oral 77 18 97 %  06/13/15 1300 (!) 97/50 mmHg - - 86 18 100 %      Intake/Output from previous day:   06/08 0701 - 06/09 0700 In: 480 [P.O.:480] Out: 50 [Drains:50]   Intake/Output this shift:       Intake/Output      06/08 0701 - 06/09 0700 06/09 0701 - 06/10 0700   P.O. 480    Total Intake(mL/kg) 480 (5.2)    Urine (mL/kg/hr) 0 (0)    Drains 50 (0)    Total Output 50     Net +430          Urine Occurrence 5 x       LABORATORY DATA:  Recent Labs  06/12/15 0637 06/13/15 0350 06/14/15 0406  WBC 10.2 14.5* 10.2  HGB 9.8* 9.6* 8.3*  HCT 32.0* 30.8* 26.7*  PLT 193 252 232    Recent Labs  06/12/15 0637 06/13/15 0350 06/14/15 0406  NA 137 135 140  K 3.3* 3.4* 3.2*  CL 103 105 111  CO2 23 25 23   BUN 15 18 20   CREATININE 1.12* 1.49* 1.29*  GLUCOSE 117* 123* 105*  CALCIUM 7.8* 7.6* 7.4*   Lab Results  Component Value Date   INR 1.01 05/30/2015   INR 0.9 03/25/2007    Recent Radiographic Studies :  Dg Chest 2 View  05/30/2015  CLINICAL DATA:  Preop evaluation prior to total knee arthroplasty. EXAM: CHEST  2 VIEW COMPARISON:  11/14/2007. FINDINGS: The heart remains  normal in size. A large hiatal hernia is again demonstrated. Small amount of linear atelectasis or scarring at the left lung base. Stable possible bone island in the anterior aspect of the right second rib. Diffuse osteopenia. Mild bilateral shoulder degenerative changes. IMPRESSION: No acute abnormality.  Stable large hiatal hernia. Electronically Signed   By: Claudie Revering M.D.   On: 05/30/2015 13:54     Examination:  General appearance: alert, cooperative and no distress  Wound Exam: clean, dry, intact   Drainage:  None: wound tissue dry  Motor Exam: EHL, FHL, Anterior Tibial and Posterior Tibial Intact  Sensory Exam: Superficial Peroneal, Deep Peroneal and Tibial normal  Vascular Exam: Normal  Assessment:    3 Days Post-Op  Procedure(s) (LRB): LEFT TOTAL KNEE ARTHROPLASTY (Left)  ADDITIONAL DIAGNOSIS:  Principal  Problem:   Primary osteoarthritis of left knee Active Problems:   S/P total knee replacement using cement  Acute Blood Loss Anemia-asymptomatic Hypokalemia-stable  Plan: Physical Therapy as ordered Partial Weight Bearing @ 50% (PWB)  DVT Prophylaxis:  Xarelto  DISCHARGE PLAN: Skilled Nursing Facility/Rehab  DISCHARGE NEEDS: HHPT, CPM, Walker and 3-in-1 comode seat Tolerating diet, good effort in PT, no complaints-will D/C to [facility] rehab today       Joni Fears W  06/14/2015 7:28 AM

## 2015-06-26 DIAGNOSIS — M25562 Pain in left knee: Secondary | ICD-10-CM | POA: Diagnosis not present

## 2015-07-24 ENCOUNTER — Other Ambulatory Visit (HOSPITAL_COMMUNITY)
Admission: RE | Admit: 2015-07-24 | Discharge: 2015-07-24 | Disposition: A | Discharge status: Home / Self Care | Payer: Medicare Other | Source: Ambulatory Visit | Attending: Cardiology | Admitting: Cardiology

## 2015-07-24 ENCOUNTER — Other Ambulatory Visit (HOSPITAL_COMMUNITY): Payer: Self-pay | Admitting: Cardiology

## 2015-07-24 ENCOUNTER — Ambulatory Visit (HOSPITAL_COMMUNITY)
Admission: RE | Admit: 2015-07-24 | Discharge: 2015-07-24 | Disposition: A | Discharge status: Home / Self Care | Payer: Medicare Other | Source: Ambulatory Visit | Attending: Cardiology | Admitting: Cardiology

## 2015-07-24 DIAGNOSIS — N289 Disorder of kidney and ureter, unspecified: Secondary | ICD-10-CM | POA: Diagnosis not present

## 2015-07-24 DIAGNOSIS — K8020 Calculus of gallbladder without cholecystitis without obstruction: Secondary | ICD-10-CM | POA: Insufficient documentation

## 2015-07-24 DIAGNOSIS — E669 Obesity, unspecified: Secondary | ICD-10-CM | POA: Diagnosis not present

## 2015-07-24 DIAGNOSIS — R110 Nausea: Secondary | ICD-10-CM | POA: Insufficient documentation

## 2015-07-24 DIAGNOSIS — R1031 Right lower quadrant pain: Secondary | ICD-10-CM | POA: Insufficient documentation

## 2015-07-24 LAB — URINALYSIS, ROUTINE W REFLEX MICROSCOPIC
BILIRUBIN URINE: NEGATIVE
Glucose, UA: NEGATIVE mg/dL
HGB URINE DIPSTICK: NEGATIVE
KETONES UR: NEGATIVE mg/dL
NITRITE: NEGATIVE
PH: 6 (ref 5.0–8.0)
Protein, ur: NEGATIVE mg/dL
SPECIFIC GRAVITY, URINE: 1.025 (ref 1.005–1.030)

## 2015-07-24 LAB — BASIC METABOLIC PANEL
ANION GAP: 7 (ref 5–15)
BUN: 17 mg/dL (ref 6–20)
CALCIUM: 8.9 mg/dL (ref 8.9–10.3)
CHLORIDE: 106 mmol/L (ref 101–111)
CO2: 24 mmol/L (ref 22–32)
Creatinine, Ser: 1.15 mg/dL — ABNORMAL HIGH (ref 0.44–1.00)
GFR calc non Af Amer: 46 mL/min — ABNORMAL LOW (ref >60)
GFR, EST AFRICAN AMERICAN: 54 mL/min — AB (ref >60)
Glucose, Bld: 109 mg/dL — ABNORMAL HIGH (ref 65–99)
Potassium: 3.1 mmol/L — ABNORMAL LOW (ref 3.5–5.1)
Sodium: 137 mmol/L (ref 135–145)

## 2015-07-24 LAB — URINE MICROSCOPIC-ADD ON

## 2015-07-24 LAB — SEDIMENTATION RATE: Sed Rate: 21 mm/hr (ref 0–22)

## 2015-07-25 LAB — URINE CULTURE

## 2015-07-25 LAB — ANTINUCLEAR ANTIBODIES, IFA: ANA Ab, IFA: NEGATIVE

## 2015-07-31 DIAGNOSIS — E669 Obesity, unspecified: Secondary | ICD-10-CM | POA: Diagnosis not present

## 2015-07-31 DIAGNOSIS — N289 Disorder of kidney and ureter, unspecified: Secondary | ICD-10-CM | POA: Diagnosis not present

## 2015-07-31 DIAGNOSIS — K8020 Calculus of gallbladder without cholecystitis without obstruction: Secondary | ICD-10-CM | POA: Diagnosis not present

## 2015-08-03 ENCOUNTER — Other Ambulatory Visit: Payer: Self-pay | Admitting: Surgery

## 2015-08-03 DIAGNOSIS — K8020 Calculus of gallbladder without cholecystitis without obstruction: Secondary | ICD-10-CM

## 2015-08-06 ENCOUNTER — Ambulatory Visit
Admission: RE | Admit: 2015-08-06 | Discharge: 2015-08-06 | Disposition: A | Discharge status: Home / Self Care | Payer: Medicare Other | Source: Ambulatory Visit | Attending: Surgery | Admitting: Surgery

## 2015-08-06 ENCOUNTER — Observation Stay (HOSPITAL_COMMUNITY)
Admission: EM | Admit: 2015-08-06 | Discharge: 2015-08-08 | Disposition: A | Discharge status: Home / Self Care | Payer: Medicare Other | Attending: General Surgery | Admitting: General Surgery

## 2015-08-06 ENCOUNTER — Emergency Department (HOSPITAL_COMMUNITY): Payer: Medicare Other

## 2015-08-06 ENCOUNTER — Encounter (HOSPITAL_COMMUNITY): Payer: Self-pay | Admitting: Emergency Medicine

## 2015-08-06 DIAGNOSIS — Z880 Allergy status to penicillin: Secondary | ICD-10-CM | POA: Diagnosis not present

## 2015-08-06 DIAGNOSIS — Z85850 Personal history of malignant neoplasm of thyroid: Secondary | ICD-10-CM | POA: Insufficient documentation

## 2015-08-06 DIAGNOSIS — M47892 Other spondylosis, cervical region: Secondary | ICD-10-CM | POA: Insufficient documentation

## 2015-08-06 DIAGNOSIS — N281 Cyst of kidney, acquired: Secondary | ICD-10-CM | POA: Diagnosis not present

## 2015-08-06 DIAGNOSIS — K8020 Calculus of gallbladder without cholecystitis without obstruction: Secondary | ICD-10-CM | POA: Diagnosis present

## 2015-08-06 DIAGNOSIS — E039 Hypothyroidism, unspecified: Secondary | ICD-10-CM | POA: Insufficient documentation

## 2015-08-06 DIAGNOSIS — I341 Nonrheumatic mitral (valve) prolapse: Secondary | ICD-10-CM | POA: Insufficient documentation

## 2015-08-06 DIAGNOSIS — Z90710 Acquired absence of both cervix and uterus: Secondary | ICD-10-CM | POA: Insufficient documentation

## 2015-08-06 DIAGNOSIS — K8010 Calculus of gallbladder with chronic cholecystitis without obstruction: Principal | ICD-10-CM | POA: Insufficient documentation

## 2015-08-06 DIAGNOSIS — Z96652 Presence of left artificial knee joint: Secondary | ICD-10-CM | POA: Insufficient documentation

## 2015-08-06 DIAGNOSIS — K5730 Diverticulosis of large intestine without perforation or abscess without bleeding: Secondary | ICD-10-CM | POA: Insufficient documentation

## 2015-08-06 DIAGNOSIS — K449 Diaphragmatic hernia without obstruction or gangrene: Secondary | ICD-10-CM | POA: Diagnosis not present

## 2015-08-06 DIAGNOSIS — K8000 Calculus of gallbladder with acute cholecystitis without obstruction: Secondary | ICD-10-CM

## 2015-08-06 DIAGNOSIS — Z885 Allergy status to narcotic agent status: Secondary | ICD-10-CM | POA: Insufficient documentation

## 2015-08-06 DIAGNOSIS — N179 Acute kidney failure, unspecified: Secondary | ICD-10-CM | POA: Diagnosis not present

## 2015-08-06 DIAGNOSIS — M109 Gout, unspecified: Secondary | ICD-10-CM | POA: Diagnosis not present

## 2015-08-06 DIAGNOSIS — F329 Major depressive disorder, single episode, unspecified: Secondary | ICD-10-CM | POA: Diagnosis not present

## 2015-08-06 DIAGNOSIS — F419 Anxiety disorder, unspecified: Secondary | ICD-10-CM | POA: Diagnosis not present

## 2015-08-06 DIAGNOSIS — R1032 Left lower quadrant pain: Secondary | ICD-10-CM | POA: Diagnosis not present

## 2015-08-06 DIAGNOSIS — Z7901 Long term (current) use of anticoagulants: Secondary | ICD-10-CM | POA: Insufficient documentation

## 2015-08-06 DIAGNOSIS — I10 Essential (primary) hypertension: Secondary | ICD-10-CM | POA: Diagnosis not present

## 2015-08-06 DIAGNOSIS — I700 Atherosclerosis of aorta: Secondary | ICD-10-CM | POA: Diagnosis not present

## 2015-08-06 DIAGNOSIS — M1712 Unilateral primary osteoarthritis, left knee: Secondary | ICD-10-CM | POA: Insufficient documentation

## 2015-08-06 DIAGNOSIS — R011 Cardiac murmur, unspecified: Secondary | ICD-10-CM | POA: Diagnosis not present

## 2015-08-06 DIAGNOSIS — E876 Hypokalemia: Secondary | ICD-10-CM | POA: Diagnosis not present

## 2015-08-06 DIAGNOSIS — E278 Other specified disorders of adrenal gland: Secondary | ICD-10-CM | POA: Diagnosis not present

## 2015-08-06 DIAGNOSIS — R1011 Right upper quadrant pain: Secondary | ICD-10-CM | POA: Diagnosis not present

## 2015-08-06 HISTORY — DX: Hypothyroidism, unspecified: E03.9

## 2015-08-06 HISTORY — DX: Unspecified osteoarthritis, unspecified site: M19.90

## 2015-08-06 HISTORY — DX: Essential (primary) hypertension: I10

## 2015-08-06 HISTORY — DX: Malignant neoplasm of thyroid gland: C73

## 2015-08-06 LAB — COMPREHENSIVE METABOLIC PANEL
ALK PHOS: 81 U/L (ref 38–126)
ALT: 19 U/L (ref 14–54)
ANION GAP: 12 (ref 5–15)
AST: 34 U/L (ref 15–41)
Albumin: 3.9 g/dL (ref 3.5–5.0)
BUN: 20 mg/dL (ref 6–20)
CALCIUM: 9.7 mg/dL (ref 8.9–10.3)
CHLORIDE: 107 mmol/L (ref 101–111)
CO2: 20 mmol/L — AB (ref 22–32)
Creatinine, Ser: 1.56 mg/dL — ABNORMAL HIGH (ref 0.44–1.00)
GFR calc non Af Amer: 32 mL/min — ABNORMAL LOW (ref >60)
GFR, EST AFRICAN AMERICAN: 37 mL/min — AB (ref >60)
Glucose, Bld: 122 mg/dL — ABNORMAL HIGH (ref 65–99)
POTASSIUM: 3.2 mmol/L — AB (ref 3.5–5.1)
SODIUM: 139 mmol/L (ref 135–145)
Total Bilirubin: 0.3 mg/dL (ref 0.3–1.2)
Total Protein: 7.6 g/dL (ref 6.5–8.1)

## 2015-08-06 LAB — LIPASE, BLOOD: Lipase: 26 U/L (ref 11–51)

## 2015-08-06 LAB — URINALYSIS, ROUTINE W REFLEX MICROSCOPIC
Bilirubin Urine: NEGATIVE
Glucose, UA: NEGATIVE mg/dL
HGB URINE DIPSTICK: NEGATIVE
Ketones, ur: 15 mg/dL — AB
Nitrite: NEGATIVE
Protein, ur: NEGATIVE mg/dL
pH: 6 (ref 5.0–8.0)

## 2015-08-06 LAB — CBC
HEMATOCRIT: 45.6 % (ref 36.0–46.0)
HEMOGLOBIN: 14.4 g/dL (ref 12.0–15.0)
MCH: 27.9 pg (ref 26.0–34.0)
MCHC: 31.6 g/dL (ref 30.0–36.0)
MCV: 88.4 fL (ref 78.0–100.0)
Platelets: 413 10*3/uL — ABNORMAL HIGH (ref 150–400)
RBC: 5.16 MIL/uL — AB (ref 3.87–5.11)
RDW: 13.7 % (ref 11.5–15.5)
WBC: 8.4 10*3/uL (ref 4.0–10.5)

## 2015-08-06 LAB — SURGICAL PCR SCREEN
MRSA, PCR: NEGATIVE
Staphylococcus aureus: NEGATIVE

## 2015-08-06 LAB — URINE MICROSCOPIC-ADD ON

## 2015-08-06 MED ORDER — SIMETHICONE 80 MG PO CHEW: 40.0000 mg | CHEWABLE_TABLET | Freq: Four times a day (QID) | ORAL | Status: DC | PRN

## 2015-08-06 MED ORDER — ONDANSETRON HCL 4 MG/2ML IJ SOLN
4.0000 mg | Freq: Once | INTRAMUSCULAR | Status: AC
  Administered 2015-08-06: 4 mg via INTRAVENOUS
  Filled 2015-08-06: qty 2

## 2015-08-06 MED ORDER — ONDANSETRON HCL 4 MG/2ML IJ SOLN
4.0000 mg | Freq: Four times a day (QID) | INTRAMUSCULAR | Status: DC | PRN
Start: 2015-08-06 — End: 2015-08-08
  Filled 2015-08-06: qty 2

## 2015-08-06 MED ORDER — MORPHINE SULFATE (PF) 4 MG/ML IV SOLN
4.0000 mg | Freq: Once | INTRAVENOUS | Status: AC
  Administered 2015-08-06: 4 mg via INTRAVENOUS
  Filled 2015-08-06: qty 1

## 2015-08-06 MED ORDER — ONDANSETRON 4 MG PO TBDP: 4.0000 mg | ORAL_TABLET | Freq: Four times a day (QID) | ORAL | Status: DC | PRN

## 2015-08-06 MED ORDER — SODIUM CHLORIDE 0.9 % IV BOLUS (SEPSIS)
1000.0000 mL | Freq: Once | INTRAVENOUS | Status: AC
  Administered 2015-08-06: 1000 mL via INTRAVENOUS

## 2015-08-06 MED ORDER — FLUOXETINE HCL 20 MG PO CAPS
20.0000 mg | ORAL_CAPSULE | Freq: Every day | ORAL | Status: DC
  Administered 2015-08-07: 20 mg via ORAL
  Filled 2015-08-06 (×2): qty 1

## 2015-08-06 MED ORDER — IOPAMIDOL (ISOVUE-300) INJECTION 61%
INTRAVENOUS | Status: AC
  Administered 2015-08-06: 80 mL
  Filled 2015-08-06: qty 100

## 2015-08-06 MED ORDER — POLYETHYLENE GLYCOL 3350 17 G PO PACK: 17.0000 g | PACK | Freq: Every day | ORAL | Status: DC | PRN

## 2015-08-06 MED ORDER — POTASSIUM CHLORIDE IN NACL 40-0.9 MEQ/L-% IV SOLN
INTRAVENOUS | Status: DC
  Administered 2015-08-06 – 2015-08-07 (×3): 125 mL/h via INTRAVENOUS
  Filled 2015-08-06 (×3): qty 1000

## 2015-08-06 MED ORDER — LEVOTHYROXINE SODIUM 88 MCG PO TABS
88.0000 ug | ORAL_TABLET | Freq: Every day | ORAL | Status: DC
  Administered 2015-08-08: 88 ug via ORAL
  Filled 2015-08-06: qty 1

## 2015-08-06 MED ORDER — CEFAZOLIN SODIUM-DEXTROSE 2-4 GM/100ML-% IV SOLN
2.0000 g | Freq: Once | INTRAVENOUS | Status: AC
  Administered 2015-08-07: 2 g via INTRAVENOUS
  Filled 2015-08-06: qty 100

## 2015-08-06 MED ORDER — MORPHINE SULFATE (PF) 2 MG/ML IV SOLN: 2.0000 mg | INTRAVENOUS | Status: DC | PRN

## 2015-08-06 NOTE — ED Provider Notes (Signed)
[facility] DEPT Provider Note   CSN: [id number] Arrival date & time: 08/06/15  1211  First Provider Contact:  First MD Initiated Contact with Patient 08/06/15 1232        History   Chief Complaint Chief Complaint  Patient presents with  . Abdominal Pain    HPI Joanne Tran is a 75 y.o. female.  The history is provided by the patient.  Abdominal Pain   This is a new problem. Episode onset: 2 weeks ago. The problem occurs constantly. The problem has been gradually worsening. Associated with: recently had knee replacement and in rehab just before sx started. The pain is located in the LLQ and RUQ. The pain is at a severity of 9/10. The pain is severe. Associated symptoms include anorexia, diarrhea, flatus and nausea. Pertinent negatives include fever, vomiting, constipation and dysuria. The symptoms are aggravated by eating. Nothing relieves the symptoms. Past workup includes ultrasound. Past workup comments: [place] of gallbladder today wtih evidence of gallstones. Her past medical history is significant for gallstones.    Past Medical History:  Diagnosis Date  . Anxiety    uses prozac  . Cancer ([place])    thyroid  . Cervical spondylosis   . Essential hypertension   . Gout   . Heart murmur    mitral valve prolapse with murmur  . Hiatal hernia   . History of mitral valve prolapse   . History of thyroid cancer March 2009  . Major depression ([place])   . Spinal headache    spinal headaches about 25 years ago    Patient Active Problem List   Diagnosis Date Noted  . Primary osteoarthritis of left knee 06/11/2015  . S/P total knee replacement using cement 06/11/2015  . MITRAL VALVE PROLAPSE 11/16/2007  . HIATAL HERNIA 11/16/2007  . COUGH 11/16/2007  . THYROID CANCER, HX OF 11/16/2007    Past Surgical History:  Procedure Laterality Date  . ABDOMINAL HYSTERECTOMY    . APPENDECTOMY     age 17  . CARDIAC CATHETERIZATION  03/08/2003   Dr. Terrence Dupont  . DILATION AND CURETTAGE  OF UTERUS    . REPLACEMENT TOTAL KNEE  06/09/2015  . TOTAL KNEE ARTHROPLASTY Left 06/11/2015   Procedure: LEFT TOTAL KNEE ARTHROPLASTY;  Surgeon: Garald Balding, MD;  Location: [facility];  Service: Orthopedics;  Laterality: Left;  . TOTAL THYROIDECTOMY     Radioactive iodine therapy Dr. Chalmers Cater    OB History    No data available       Home Medications    Prior to Admission medications   Medication Sig Start Date End Date Taking? Authorizing Provider  acetaminophen (TYLENOL) 325 MG tablet Take 2 tablets (650 mg total) by mouth every 6 (six) hours as needed. 06/13/15   Cherylann Ratel, PA-C  atenolol (TENORMIN) 50 MG tablet Take 50 mg by mouth daily.    Historical Provider, MD  colchicine 0.6 MG tablet Take 1 tablet (0.6 mg total) by mouth 2 (two) times daily as needed (FOR GOUT ATTACK). 06/13/15   Cherylann Ratel, PA-C  FLUoxetine (PROZAC) 20 MG capsule Take 20 mg by mouth daily.    Historical Provider, MD  hydrochlorothiazide (HYDRODIURIL) 25 MG tablet Take 25 mg by mouth daily.    Historical Provider, MD  levothyroxine (SYNTHROID, LEVOTHROID) 88 MCG tablet Take 88 mcg by mouth daily before breakfast.    Historical Provider, MD  lisinopril (PRINIVIL,ZESTRIL) 40 MG tablet Take 40 mg by mouth daily.    Historical Provider,  MD  methocarbamol (ROBAXIN) 500 MG tablet Take 1 tablet (500 mg total) by mouth every 8 (eight) hours as needed for muscle spasms. 06/13/15   Cherylann Ratel, PA-C  oxyCODONE (OXY IR/ROXICODONE) 5 MG immediate release tablet Take 1-2 tablets (5-10 mg total) by mouth every 4 (four) hours as needed for moderate pain, severe pain or breakthrough pain. 06/13/15   Cherylann Ratel, PA-C  rivaroxaban (XARELTO) 10 MG TABS tablet Take 1 tablet (10 mg total) by mouth daily with breakfast. 06/13/15   Cherylann Ratel, PA-C    Family History Family History  Problem Relation Age of Onset  . Heart disease Father   . Heart attack Father   . Heart disease Mother   . Heart attack Mother   .  Ovarian cancer Sister   . Heart disease Paternal Uncle     CABG    Social History Social History  Substance Use Topics  . Smoking status: Never Smoker  . Smokeless tobacco: Never Used  . Alcohol use No     Allergies   Codeine and Penicillins   Review of Systems Review of Systems  Constitutional: Negative for fever.  Gastrointestinal: Positive for abdominal pain, anorexia, diarrhea, flatus and nausea. Negative for constipation and vomiting.  Genitourinary: Negative for dysuria.  All other systems reviewed and are negative.    Physical Exam Updated Vital Signs BP 125/72 (BP Location: Left Arm)   Pulse 82   Temp 98.7 F (37.1 C) (Oral)   Resp 18   Ht 5\' 5"  (1.651 m)   Wt 190 lb (86.2 kg)   SpO2 97%   BMI 31.62 kg/m   Physical Exam  Constitutional: She is oriented to person, place, and time. She appears well-developed and well-nourished. She appears distressed.  HENT:  Head: Normocephalic and atraumatic.  Mouth/Throat: Mucous membranes are dry.  Eyes: EOM are normal. Pupils are equal, round, and reactive to light.  Cardiovascular: Regular rhythm, normal heart sounds and intact distal pulses.  Tachycardia present.  Exam reveals no friction rub.   No murmur heard. Pulmonary/Chest: Effort normal and breath sounds normal. She has no wheezes. She has no rales.  Abdominal: Soft. Bowel sounds are normal. She exhibits no distension. There is tenderness in the right upper quadrant and left lower quadrant. There is guarding. There is no rebound and negative Murphy's sign.  Musculoskeletal: Normal range of motion. She exhibits no tenderness.  No edema  Neurological: She is alert and oriented to person, place, and time. No cranial nerve deficit.  Skin: Skin is warm and dry. No rash noted.  Psychiatric: She has a normal mood and affect. Her behavior is normal.  Nursing note and vitals reviewed.    ED Treatments / Results  Labs (all labs ordered are listed, but only  abnormal results are displayed) Labs Reviewed  COMPREHENSIVE METABOLIC PANEL - Abnormal; Notable for the following:       Result Value   Potassium 3.2 (*)    CO2 20 (*)    Glucose, Bld 122 (*)    Creatinine, Ser 1.56 (*)    GFR calc non Af Amer 32 (*)    GFR calc Af Amer 37 (*)    All other components within normal limits  CBC - Abnormal; Notable for the following:    RBC 5.16 (*)    Platelets 413 (*)    All other components within normal limits  C DIFFICILE QUICK SCREEN W PCR REFLEX  LIPASE, BLOOD  URINALYSIS, ROUTINE W  REFLEX MICROSCOPIC (NOT AT [facility])    EKG  EKG Interpretation None       Radiology Ct Abdomen Pelvis W Contrast  Result Date: 08/06/2015 CLINICAL DATA:  Left lower quadrant abdominal pain with nausea and diarrhea for 2 weeks. EXAM: CT ABDOMEN AND PELVIS WITH CONTRAST TECHNIQUE: Multidetector CT imaging of the abdomen and pelvis was performed using the standard protocol following bolus administration of intravenous contrast. CONTRAST:  54mL ISOVUE-300 IOPAMIDOL (ISOVUE-300) INJECTION 61% COMPARISON:  None. FINDINGS: Lower chest: Large hiatal hernia which contains the stomach, significant portion of the duodenum and pancreas. Probable chronic atelectasis at the overlying left lung base. Hepatobiliary: Large stone within the otherwise normal-appearing gallbladder. Liver appears normal. Pancreas: No mass, inflammatory changes, or other significant abnormality. Herniated into the lower mediastinum at the hiatal hernia, as above. Spleen: Within normal limits in size and appearance. Adrenals/Urinary Tract: Hypodense mass within the right adrenal gland measures 1.8 cm greatest dimension, with CT density measurements of 45-50 Hounsfield units. Left adrenal gland appears normal. Bilateral renal cysts. No renal stone or hydronephrosis bilaterally. No ureteral or bladder calculi identified. Bladder is decompressed. Stomach/Bowel: Bowel is normal in caliber. No bowel wall thickening  or evidence of bowel wall inflammation seen. Mild diverticulosis of the sigmoid colon without evidence of acute diverticulitis. Status post appendectomy. Vascular/Lymphatic: Scattered atherosclerotic changes of the normal caliber abdominal aorta. No enlarged lymph nodes seen within the abdomen or pelvis. Reproductive: Status post hysterectomy. No mass or inflammatory change within either adnexal region. Other: No free fluid or abscess collection. No free intraperitoneal air. Musculoskeletal: Degenerative changes of the thoracolumbar spine, mild to moderate in degree. No acute or suspicious osseous finding. Superficial soft tissues are unremarkable. IMPRESSION: 1. No acute findings. No bowel obstruction or evidence of bowel wall inflammation. No free fluid or abscess. No evidence of acute solid organ abnormality. No renal or ureteral calculi. 2. Large hiatal hernia containing the stomach, duodenum and pancreas. 3. Cholelithiasis without evidence of acute cholecystitis. 4. Right adrenal mass measuring 1.8 cm, not able to be confirmed as a benign adrenal adenoma by CT density measurements. Consider nonemergent adrenal protocol MRI to ensure benignity. 5. Colonic diverticulosis without evidence of acute diverticulitis. 6. Aortic atherosclerosis. Electronically Signed   By: Franki Cabot M.D.   On: 08/06/2015 14:20   US Abdomen Limited  Result Date: 08/06/2015 CLINICAL DATA:  Suspected gallstones from abdominal series of July 24, 2015; patient reports 2 weeks of abdominal pain with nausea EXAM: US ABDOMEN LIMITED - RIGHT UPPER QUADRANT COMPARISON:  Acute abdominal series of July 24, 2015 FINDINGS: Gallbladder: The gallbladder is only partially distended and contains multiple echogenic shadowing stones. There is no gallbladder wall thickening, pericholecystic fluid, or positive sonographic Murphy's sign. Common bile duct: Diameter: 3.2 mm. Liver: The hepatic echotexture is normal. There is no focal mass or ductal  dilation. IMPRESSION: Gallstones without sonographic evidence of acute cholecystitis. Normal appearance of the liver and common bile duct. Electronically Signed   By: David  Martinique M.D.   On: 08/06/2015 12:50    Procedures Procedures (including critical care time)  Medications Ordered in ED Medications  sodium chloride 0.9 % bolus 1,000 mL (1,000 mLs Intravenous New Bag/Given 08/06/15 1331)  iopamidol (ISOVUE-300) 61 % injection (not administered)  ondansetron (ZOFRAN) injection 4 mg (4 mg Intravenous Given 08/06/15 1327)  morphine 4 MG/ML injection 4 mg (4 mg Intravenous Given 08/06/15 1328)     Initial Impression / Assessment and Plan / ED Course  I have reviewed  the triage vital signs and the nursing notes.  Pertinent labs & imaging results that were available during my care of the patient were reviewed by me and considered in my medical decision making (see chart for details).  Clinical Course    Patient is a 75 year old female presenting today with 2 weeks of ongoing nausea, worsening abdominal pain and diarrhea. She is having more than 10 watery stools per day anytime she tries to eat or drink. Pain is only worsening and severe nausea. She has had no vomiting.  Patient seen by PCP and had ultrasound today that showed gallstones without signs of cholecystitis or Murphy sign. Patient states her pain and diarrhea worsening and she was unable to eat with worsening pain came here for further evaluation. Patient appears dehydrated and has significant right upper quadrant but more significant left lower quadrant tenderness.  Insert for possible diverticulitis versus C. difficile. Patient recently had a knee replacement where she did receive 1 dose of antibiotic but has also been in a rehabilitation facility. She has not been checked for C. difficile. Feel the patient needs a CT to rule out diverticulitis before assuming it's her gallbladder.  CBC, CMP, lipase all without acute findings. C.  difficile pending. CT pending. Patient given IV fluids, pain and nausea medicine.  2:35 PM Labs without acute findings.  CT neg for diverticulitis. Spoke with Dr. Hulen Skains who will see the pt  Final Clinical Impressions(s) / ED Diagnoses   Final diagnoses:  [place] of gallbladder without cholecystitis without obstruction    New Prescriptions New Prescriptions   No medications on file     Blanchie Dessert, MD 08/06/15 1441

## 2015-08-06 NOTE — ED Triage Notes (Signed)
Pt stated that her symptoms started 2 weeks ago with nausea and progressed to diarrhea.  Pt came today from [place] where she had a ultrasound done that showed she had gallstones. Pt states she has been dizzy due to no being able to eat or drink for the past 4 days. Pt has pain in the RUQ and LLQ

## 2015-08-06 NOTE — Consult Note (Signed)
[facility] Surgery Consult Note  Joanne Tran [birth year]  [id number].    Requesting MD: Dr. Maryan Rued Chief Complaint/Reason for Consult: abdominal pain  HPI:  75 y/o female with PMH of depression, hiatal hernia (seen by Dr. Johnathan Hausen), thyroid cancer, left knee arthroplasty (06/09/2015), and known gallstones presents to [facility] with worsening abdominal pain. Was having a RUQ U/S at [facility] radiology prior to coming to ED- significant for gallstones without signs of cholecystitis. Patient's RUQ pain started 2 weeks ago and is associated with nausea, diarrhea, and decreased appetite. Patient reports she has been drinking a lot of fluid but is unable to toelrate anything by mouth without nausea. Nothing improves her nausea. Eating increases her pain. >5 watery, non-bloody BMs daily. CT scan ordered by EDP was negative for acute cholecystitis and diverticulitis. Previous abdominal surgeries include abdominal hysterectomy and appendectomy. Not on any blood thinning medications.  AST/ALT/Alk phos/tbili are normal. No Leukocytosis.   ROS: All systems reviewed and otherwise negative except for as above  Family History  Problem Relation Age of Onset  . Heart disease Father   . Heart attack Father   . Heart disease Mother   . Heart attack Mother   . Ovarian cancer Sister   . Heart disease Paternal Uncle     CABG    Past Medical History:  Diagnosis Date  . Anxiety    uses prozac  . Cancer ([place])    thyroid  . Cervical spondylosis   . Essential hypertension   . Gout   . Heart murmur    mitral valve prolapse with murmur  . Hiatal hernia   . History of mitral valve prolapse   . History of thyroid cancer March 2009  . Major depression ([place])   . Spinal headache    spinal headaches about 25 years ago    Past Surgical History:  Procedure Laterality Date  . ABDOMINAL HYSTERECTOMY    . APPENDECTOMY     age 44  . CARDIAC CATHETERIZATION  03/08/2003   Dr. Terrence Dupont  .  DILATION AND CURETTAGE OF UTERUS    . REPLACEMENT TOTAL KNEE  06/09/2015  . TOTAL KNEE ARTHROPLASTY Left 06/11/2015   Procedure: LEFT TOTAL KNEE ARTHROPLASTY;  Surgeon: Garald Balding, MD;  Location: [facility];  Service: Orthopedics;  Laterality: Left;  . TOTAL THYROIDECTOMY     Radioactive iodine therapy Dr. Chalmers Cater    Social History:  reports that she has never smoked. She has never used smokeless tobacco. She reports that she does not drink alcohol or use drugs.  Allergies:  Allergies  Allergen Reactions  . Codeine Itching  . Penicillins Rash    When she was younger     (Not in a hospital admission)  Blood pressure 125/72, pulse 82, temperature 98.7 F (37.1 C), temperature source Oral, resp. rate 18, height _0  (1.651 m), weight 86.2 kg (190 lb), SpO2 97 %. Physical Exam: General: pleasant, obese white female who is laying in bed in NAD HEENT: head is normocephalic, atraumatic.  Sclera are noninjected. Ears and nose without any masses or lesions.  Mouth is pink and moist Heart: tachycardic, regular rhythm.  No obvious murmurs, gallops, or rubs noted.  Palpable pedal pulses bilaterally Lungs: CTAB, no wheezes, rhonchi, or rales noted.  Respiratory effort nonlabored Abd: soft, NT/ND, +BS, no masses, hernias, or organomegaly MS: all 4 extremities are symmetrical with no cyanosis, clubbing, or edema. Skin: warm and dry with no masses, lesions, or rashes Psych: A&Ox3 with an  appropriate affect. Neuro:  normal speech  Results for orders placed or performed during the hospital encounter of 08/06/15 (from the past 48 hour(s))  Lipase, blood     Status: None   Collection Time: 08/06/15 12:19 PM  Result Value Ref Range   Lipase 26 11 - 51 U/L  Comprehensive metabolic panel     Status: Abnormal   Collection Time: 08/06/15 12:19 PM  Result Value Ref Range   Sodium 139 135 - 145 mmol/L   Potassium 3.2 (L) 3.5 - 5.1 mmol/L   Chloride 107 101 - 111 mmol/L   CO2 20 (L) 22 - 32 mmol/L    Glucose, Bld 122 (H) 65 - 99 mg/dL   BUN 20 6 - 20 mg/dL   Creatinine, Ser 1.56 (H) 0.44 - 1.00 mg/dL   Calcium 9.7 8.9 - 10.3 mg/dL   Total Protein 7.6 6.5 - 8.1 g/dL   Albumin 3.9 3.5 - 5.0 g/dL   AST 34 15 - 41 U/L   ALT 19 14 - 54 U/L   Alkaline Phosphatase 81 38 - 126 U/L   Total Bilirubin 0.3 0.3 - 1.2 mg/dL   GFR calc non Af Amer 32 (L) >60 mL/min   GFR calc Af Amer 37 (L) >60 mL/min    Comment: (NOTE) The eGFR has been calculated using the CKD EPI equation. This calculation has not been validated in all clinical situations. eGFR's persistently <60 mL/min signify possible Chronic Kidney Disease.    Anion gap 12 5 - 15  CBC     Status: Abnormal   Collection Time: 08/06/15 12:19 PM  Result Value Ref Range   WBC 8.4 4.0 - 10.5 K/uL   RBC 5.16 (H) 3.87 - 5.11 MIL/uL   Hemoglobin 14.4 12.0 - 15.0 g/dL   HCT 45.6 36.0 - 46.0 %   MCV 88.4 78.0 - 100.0 fL   MCH 27.9 26.0 - 34.0 pg   MCHC 31.6 30.0 - 36.0 g/dL   RDW 13.7 11.5 - 15.5 %   Platelets 413 (H) 150 - 400 K/uL   US Abdomen Limited  Result Date: 08/06/2015 CLINICAL DATA:  Suspected gallstones from abdominal series of July 24, 2015; patient reports 2 weeks of abdominal pain with nausea EXAM: US ABDOMEN LIMITED - RIGHT UPPER QUADRANT COMPARISON:  Acute abdominal series of July 24, 2015 FINDINGS: Gallbladder: The gallbladder is only partially distended and contains multiple echogenic shadowing stones. There is no gallbladder wall thickening, pericholecystic fluid, or positive sonographic Murphy's sign. Common bile duct: Diameter: 3.2 mm. Liver: The hepatic echotexture is normal. There is no focal mass or ductal dilation. IMPRESSION: Gallstones without sonographic evidence of acute cholecystitis. Normal appearance of the liver and common bile duct. Electronically Signed   By: David  Martinique M.D.   On: 08/06/2015 12:50   Assessment/Plan Symptomatic cholelithiasis  - NPO after MN, IVF, analgesics, antiemetics - laparoscopic  cholecystectomy tomorrow by Dr. Hulen Skains  Diarrhea- c.dif pending  Hypokalemia - 3.2 AKI- likely secondary to dehydration, IVF and check CMP in AM  FEN: IVF NS + K, NPO after MN ID: perioperative abx DVT Proph: SCD's Dispo: lap chole with IOC tomorrow   Risks associated with surgery including, but not limited to, infection, bleeding, damage to surrounding structures, conversion to open, MI, and respiratory compromise discussed in detail with the patient and she wishes to proceed.   Jill Alexanders, [facility] Surgery 08/06/2015, 1:55 PM Pager: [phone] Consults: [phone] Mon-Fri 7:00 am-4:30 pm Sat-Sun 7:00 am-11:30 am

## 2015-08-06 NOTE — Progress Notes (Signed)
Received pt to room 6n13 from ED, son at bedside

## 2015-08-06 NOTE — ED Notes (Signed)
Admitting at bedside 

## 2015-08-07 ENCOUNTER — Observation Stay (HOSPITAL_COMMUNITY): Payer: Medicare Other | Admitting: Anesthesiology

## 2015-08-07 ENCOUNTER — Encounter (HOSPITAL_COMMUNITY)
Admission: EM | Disposition: A | Discharge status: Home / Self Care | Payer: Self-pay | Source: Home / Self Care | Attending: Emergency Medicine

## 2015-08-07 DIAGNOSIS — K8020 Calculus of gallbladder without cholecystitis without obstruction: Secondary | ICD-10-CM | POA: Diagnosis not present

## 2015-08-07 DIAGNOSIS — M1712 Unilateral primary osteoarthritis, left knee: Secondary | ICD-10-CM | POA: Diagnosis not present

## 2015-08-07 DIAGNOSIS — E278 Other specified disorders of adrenal gland: Secondary | ICD-10-CM | POA: Diagnosis not present

## 2015-08-07 DIAGNOSIS — I341 Nonrheumatic mitral (valve) prolapse: Secondary | ICD-10-CM | POA: Diagnosis not present

## 2015-08-07 DIAGNOSIS — K449 Diaphragmatic hernia without obstruction or gangrene: Secondary | ICD-10-CM | POA: Diagnosis not present

## 2015-08-07 DIAGNOSIS — K8010 Calculus of gallbladder with chronic cholecystitis without obstruction: Secondary | ICD-10-CM | POA: Diagnosis not present

## 2015-08-07 DIAGNOSIS — F329 Major depressive disorder, single episode, unspecified: Secondary | ICD-10-CM | POA: Diagnosis not present

## 2015-08-07 DIAGNOSIS — R1011 Right upper quadrant pain: Secondary | ICD-10-CM | POA: Diagnosis not present

## 2015-08-07 DIAGNOSIS — F419 Anxiety disorder, unspecified: Secondary | ICD-10-CM | POA: Diagnosis not present

## 2015-08-07 DIAGNOSIS — I700 Atherosclerosis of aorta: Secondary | ICD-10-CM | POA: Diagnosis not present

## 2015-08-07 DIAGNOSIS — I10 Essential (primary) hypertension: Secondary | ICD-10-CM | POA: Diagnosis not present

## 2015-08-07 DIAGNOSIS — K5730 Diverticulosis of large intestine without perforation or abscess without bleeding: Secondary | ICD-10-CM | POA: Diagnosis not present

## 2015-08-07 DIAGNOSIS — N281 Cyst of kidney, acquired: Secondary | ICD-10-CM | POA: Diagnosis not present

## 2015-08-07 DIAGNOSIS — M109 Gout, unspecified: Secondary | ICD-10-CM | POA: Diagnosis not present

## 2015-08-07 HISTORY — PX: CHOLECYSTECTOMY: SHX55

## 2015-08-07 LAB — CBC
HEMATOCRIT: 38 % (ref 36.0–46.0)
Hemoglobin: 11.5 g/dL — ABNORMAL LOW (ref 12.0–15.0)
MCH: 26.9 pg (ref 26.0–34.0)
MCHC: 30.3 g/dL (ref 30.0–36.0)
MCV: 88.8 fL (ref 78.0–100.0)
Platelets: 236 10*3/uL (ref 150–400)
RBC: 4.28 MIL/uL (ref 3.87–5.11)
RDW: 13.9 % (ref 11.5–15.5)
WBC: 4.9 10*3/uL (ref 4.0–10.5)

## 2015-08-07 LAB — COMPREHENSIVE METABOLIC PANEL
ALBUMIN: 3 g/dL — AB (ref 3.5–5.0)
ALK PHOS: 59 U/L (ref 38–126)
ALT: 15 U/L (ref 14–54)
AST: 21 U/L (ref 15–41)
Anion gap: 7 (ref 5–15)
BILIRUBIN TOTAL: 0.4 mg/dL (ref 0.3–1.2)
BUN: 17 mg/dL (ref 6–20)
CALCIUM: 8.4 mg/dL — AB (ref 8.9–10.3)
CO2: 23 mmol/L (ref 22–32)
Chloride: 112 mmol/L — ABNORMAL HIGH (ref 101–111)
Creatinine, Ser: 1.33 mg/dL — ABNORMAL HIGH (ref 0.44–1.00)
GFR calc Af Amer: 45 mL/min — ABNORMAL LOW (ref >60)
GFR calc non Af Amer: 39 mL/min — ABNORMAL LOW (ref >60)
GLUCOSE: 93 mg/dL (ref 65–99)
POTASSIUM: 3.5 mmol/L (ref 3.5–5.1)
SODIUM: 142 mmol/L (ref 135–145)
TOTAL PROTEIN: 5.6 g/dL — AB (ref 6.5–8.1)

## 2015-08-07 SURGERY — LAPAROSCOPIC CHOLECYSTECTOMY
Anesthesia: General | Site: Abdomen

## 2015-08-07 MED ORDER — SUGAMMADEX SODIUM 200 MG/2ML IV SOLN
INTRAVENOUS | Status: DC | PRN
  Administered 2015-08-07: 200 mg via INTRAVENOUS

## 2015-08-07 MED ORDER — HYDROCHLOROTHIAZIDE 25 MG PO TABS
25.0000 mg | ORAL_TABLET | Freq: Every day | ORAL | Status: DC
  Administered 2015-08-07: 25 mg via ORAL
  Filled 2015-08-07 (×2): qty 1

## 2015-08-07 MED ORDER — BUPIVACAINE-EPINEPHRINE 0.25% -1:200000 IJ SOLN
INTRAMUSCULAR | Status: DC | PRN
  Administered 2015-08-07: 15 mL

## 2015-08-07 MED ORDER — ATENOLOL 50 MG PO TABS
50.0000 mg | ORAL_TABLET | Freq: Every day | ORAL | Status: DC
  Administered 2015-08-07: 50 mg via ORAL
  Filled 2015-08-07 (×2): qty 1

## 2015-08-07 MED ORDER — IOPAMIDOL (ISOVUE-300) INJECTION 61%
INTRAVENOUS | Status: AC
  Filled 2015-08-07: qty 50

## 2015-08-07 MED ORDER — METHOCARBAMOL 500 MG PO TABS
500.0000 mg | ORAL_TABLET | Freq: Three times a day (TID) | ORAL | Status: DC | PRN
  Administered 2015-08-07 – 2015-08-08 (×2): 500 mg via ORAL
  Filled 2015-08-07 (×2): qty 1

## 2015-08-07 MED ORDER — FENTANYL CITRATE (PF) 250 MCG/5ML IJ SOLN
INTRAMUSCULAR | Status: AC
  Filled 2015-08-07: qty 5

## 2015-08-07 MED ORDER — LIDOCAINE HCL (CARDIAC) 20 MG/ML IV SOLN
INTRAVENOUS | Status: DC | PRN
  Administered 2015-08-07: 40 mg via INTRAVENOUS

## 2015-08-07 MED ORDER — FENTANYL CITRATE (PF) 100 MCG/2ML IJ SOLN
INTRAMUSCULAR | Status: DC | PRN
  Administered 2015-08-07: 50 ug via INTRAVENOUS
  Administered 2015-08-07 (×2): 100 ug via INTRAVENOUS

## 2015-08-07 MED ORDER — ONDANSETRON HCL 4 MG/2ML IJ SOLN
INTRAMUSCULAR | Status: DC | PRN
  Administered 2015-08-07 (×2): 4 mg via INTRAVENOUS

## 2015-08-07 MED ORDER — LISINOPRIL 40 MG PO TABS
40.0000 mg | ORAL_TABLET | Freq: Every day | ORAL | Status: DC
  Administered 2015-08-07: 40 mg via ORAL
  Filled 2015-08-07 (×2): qty 1

## 2015-08-07 MED ORDER — LACTATED RINGERS IV SOLN
INTRAVENOUS | Status: DC | PRN
  Administered 2015-08-07 (×2): via INTRAVENOUS

## 2015-08-07 MED ORDER — MORPHINE SULFATE (PF) 2 MG/ML IV SOLN
2.0000 mg | INTRAVENOUS | Status: DC | PRN
  Administered 2015-08-07: 2 mg via INTRAVENOUS
  Filled 2015-08-07: qty 1

## 2015-08-07 MED ORDER — HYDROMORPHONE HCL 1 MG/ML IJ SOLN
INTRAMUSCULAR | Status: AC
  Filled 2015-08-07: qty 1

## 2015-08-07 MED ORDER — LIDOCAINE 2% (20 MG/ML) 5 ML SYRINGE
INTRAMUSCULAR | Status: AC
  Filled 2015-08-07: qty 5

## 2015-08-07 MED ORDER — POTASSIUM CHLORIDE IN NACL 40-0.9 MEQ/L-% IV SOLN
INTRAVENOUS | Status: DC
  Administered 2015-08-07 – 2015-08-08 (×3): 75 mL/h via INTRAVENOUS
  Filled 2015-08-07: qty 1000

## 2015-08-07 MED ORDER — PROPOFOL 10 MG/ML IV BOLUS
INTRAVENOUS | Status: DC | PRN
  Administered 2015-08-07: 130 mg via INTRAVENOUS

## 2015-08-07 MED ORDER — ROCURONIUM BROMIDE 50 MG/5ML IV SOLN
INTRAVENOUS | Status: AC
  Filled 2015-08-07: qty 1

## 2015-08-07 MED ORDER — DEXAMETHASONE SODIUM PHOSPHATE 10 MG/ML IJ SOLN
INTRAMUSCULAR | Status: DC | PRN
  Administered 2015-08-07: 5 mg via INTRAVENOUS

## 2015-08-07 MED ORDER — ROCURONIUM BROMIDE 100 MG/10ML IV SOLN
INTRAVENOUS | Status: DC | PRN
  Administered 2015-08-07: 30 mg via INTRAVENOUS

## 2015-08-07 MED ORDER — PROMETHAZINE HCL 25 MG/ML IJ SOLN: 6.2500 mg | INTRAMUSCULAR | Status: DC | PRN

## 2015-08-07 MED ORDER — LACTATED RINGERS IV SOLN
INTRAVENOUS | Status: DC
  Administered 2015-08-07: 10:00:00 via INTRAVENOUS

## 2015-08-07 MED ORDER — SUGAMMADEX SODIUM 500 MG/5ML IV SOLN
INTRAVENOUS | Status: AC
Start: 2015-08-07 — End: 2015-08-07
  Filled 2015-08-07: qty 5

## 2015-08-07 MED ORDER — ONDANSETRON HCL 4 MG/2ML IJ SOLN
INTRAMUSCULAR | Status: AC
  Filled 2015-08-07: qty 2

## 2015-08-07 MED ORDER — PROPOFOL 10 MG/ML IV BOLUS
INTRAVENOUS | Status: AC
  Filled 2015-08-07: qty 20

## 2015-08-07 MED ORDER — HYDROMORPHONE HCL 1 MG/ML IJ SOLN
0.2500 mg | INTRAMUSCULAR | Status: DC | PRN
  Administered 2015-08-07 (×2): 0.5 mg via INTRAVENOUS

## 2015-08-07 MED ORDER — LACTATED RINGERS IV SOLN: INTRAVENOUS | Status: DC

## 2015-08-07 MED ORDER — OXYCODONE HCL 5 MG PO TABS
5.0000 mg | ORAL_TABLET | ORAL | Status: DC | PRN
  Administered 2015-08-07 – 2015-08-08 (×3): 10 mg via ORAL
  Filled 2015-08-07 (×3): qty 2

## 2015-08-07 MED ORDER — BUPIVACAINE-EPINEPHRINE (PF) 0.25% -1:200000 IJ SOLN
INTRAMUSCULAR | Status: AC
  Filled 2015-08-07: qty 30

## 2015-08-07 MED ORDER — SODIUM CHLORIDE 0.9 % IR SOLN
Status: DC | PRN
Start: 2015-08-07 — End: 2015-08-07
  Administered 2015-08-07: 1000 mL

## 2015-08-07 MED ORDER — MEPERIDINE HCL 25 MG/ML IJ SOLN: 6.2500 mg | INTRAMUSCULAR | Status: DC | PRN

## 2015-08-07 MED ORDER — DEXAMETHASONE SODIUM PHOSPHATE 10 MG/ML IJ SOLN
INTRAMUSCULAR | Status: AC
  Filled 2015-08-07: qty 1

## 2015-08-07 MED ORDER — 0.9 % SODIUM CHLORIDE (POUR BTL) OPTIME
TOPICAL | Status: DC | PRN
  Administered 2015-08-07: 100 mL

## 2015-08-07 SURGICAL SUPPLY — 48 items
APPLIER CLIP 5 13 M/L LIGAMAX5 (MISCELLANEOUS) ×4
BLADE SURG ROTATE 9660 (MISCELLANEOUS) IMPLANT
CANISTER SUCTION 2500CC (MISCELLANEOUS) ×4 IMPLANT
CHLORAPREP W/TINT 26ML (MISCELLANEOUS) ×4 IMPLANT
CLIP APPLIE 5 13 M/L LIGAMAX5 (MISCELLANEOUS) ×2 IMPLANT
CLOSURE STERI-STRIP 1/2X4 (GAUZE/BANDAGES/DRESSINGS) ×1
CLOSURE WOUND 1/2 X4 (GAUZE/BANDAGES/DRESSINGS) ×1
CLSR STERI-STRIP ANTIMIC 1/2X4 (GAUZE/BANDAGES/DRESSINGS) ×3 IMPLANT
COVER MAYO STAND STRL (DRAPES) IMPLANT
COVER SURGICAL LIGHT HANDLE (MISCELLANEOUS) ×4 IMPLANT
DERMABOND ADVANCED (GAUZE/BANDAGES/DRESSINGS) ×2
DERMABOND ADVANCED .7 DNX12 (GAUZE/BANDAGES/DRESSINGS) ×2 IMPLANT
DRAPE C-ARM 42X72 X-RAY (DRAPES) IMPLANT
DRSG TEGADERM 2-3/8X2-3/4 SM (GAUZE/BANDAGES/DRESSINGS) ×16 IMPLANT
ELECT REM PT RETURN 9FT ADLT (ELECTROSURGICAL) ×4
ELECTRODE REM PT RTRN 9FT ADLT (ELECTROSURGICAL) ×2 IMPLANT
GLOVE BIOGEL PI IND STRL 7.0 (GLOVE) ×2 IMPLANT
GLOVE BIOGEL PI IND STRL 7.5 (GLOVE) ×2 IMPLANT
GLOVE BIOGEL PI IND STRL 8 (GLOVE) ×2 IMPLANT
GLOVE BIOGEL PI INDICATOR 7.0 (GLOVE) ×2
GLOVE BIOGEL PI INDICATOR 7.5 (GLOVE) ×2
GLOVE BIOGEL PI INDICATOR 8 (GLOVE) ×2
GLOVE ECLIPSE 7.5 STRL STRAW (GLOVE) ×8 IMPLANT
GLOVE SURG SS PI 6.5 STRL IVOR (GLOVE) ×4 IMPLANT
GOWN STRL REUS W/ TWL LRG LVL3 (GOWN DISPOSABLE) ×6 IMPLANT
GOWN STRL REUS W/TWL LRG LVL3 (GOWN DISPOSABLE) ×6
KIT BASIN OR (CUSTOM PROCEDURE TRAY) ×4 IMPLANT
KIT ROOM TURNOVER OR (KITS) ×4 IMPLANT
L-HOOK LAP DISP 36CM (ELECTROSURGICAL) ×4
LHOOK LAP DISP 36CM (ELECTROSURGICAL) ×2 IMPLANT
NS IRRIG 1000ML POUR BTL (IV SOLUTION) ×4 IMPLANT
PAD ARMBOARD 7.5X6 YLW CONV (MISCELLANEOUS) ×4 IMPLANT
PENCIL BUTTON HOLSTER BLD 10FT (ELECTRODE) ×4 IMPLANT
POUCH RETRIEVAL ECOSAC 10 (ENDOMECHANICALS) ×2 IMPLANT
POUCH RETRIEVAL ECOSAC 10MM (ENDOMECHANICALS) ×2
SCISSORS LAP 5X35 DISP (ENDOMECHANICALS) ×4 IMPLANT
SET CHOLANGIOGRAPH 5 50 .035 (SET/KITS/TRAYS/PACK) IMPLANT
SET IRRIG TUBING LAPAROSCOPIC (IRRIGATION / IRRIGATOR) ×4 IMPLANT
SLEEVE ENDOPATH XCEL 5M (ENDOMECHANICALS) ×8 IMPLANT
SPECIMEN JAR SMALL (MISCELLANEOUS) ×4 IMPLANT
STRIP CLOSURE SKIN 1/2X4 (GAUZE/BANDAGES/DRESSINGS) ×3 IMPLANT
SUT MNCRL AB 4-0 PS2 18 (SUTURE) ×8 IMPLANT
TOWEL OR 17X24 6PK STRL BLUE (TOWEL DISPOSABLE) ×4 IMPLANT
TOWEL OR 17X26 10 PK STRL BLUE (TOWEL DISPOSABLE) ×4 IMPLANT
TRAY LAPAROSCOPIC MC (CUSTOM PROCEDURE TRAY) ×4 IMPLANT
TROCAR XCEL BLUNT TIP 100MML (ENDOMECHANICALS) ×4 IMPLANT
TROCAR XCEL NON-BLD 5MMX100MML (ENDOMECHANICALS) ×4 IMPLANT
TUBING INSUFFLATION (TUBING) ×4 IMPLANT

## 2015-08-07 NOTE — H&P (View-Only) (Signed)
[facility] Surgery Consult Note  Joanne Tran [birth year]  [id number].    Requesting MD: Dr. Maryan Rued Chief Complaint/Reason for Consult: abdominal pain  HPI:  75 y/o female with PMH of depression, hiatal hernia (seen by Dr. Johnathan Hausen), thyroid cancer, left knee arthroplasty (06/09/2015), and known gallstones presents to [facility] with worsening abdominal pain. Was having a RUQ U/S at [facility] radiology prior to coming to ED- significant for gallstones without signs of cholecystitis. Patient's RUQ pain started 2 weeks ago and is associated with nausea, diarrhea, and decreased appetite. Patient reports she has been drinking a lot of fluid but is unable to toelrate anything by mouth without nausea. Nothing improves her nausea. Eating increases her pain. >5 watery, non-bloody BMs daily. CT scan ordered by EDP was negative for acute cholecystitis and diverticulitis. Previous abdominal surgeries include abdominal hysterectomy and appendectomy. Not on any blood thinning medications.  AST/ALT/Alk phos/tbili are normal. No Leukocytosis.   ROS: All systems reviewed and otherwise negative except for as above  Family History  Problem Relation Age of Onset  . Heart disease Father   . Heart attack Father   . Heart disease Mother   . Heart attack Mother   . Ovarian cancer Sister   . Heart disease Paternal Uncle     CABG    Past Medical History:  Diagnosis Date  . Anxiety    uses prozac  . Cancer ([place])    thyroid  . Cervical spondylosis   . Essential hypertension   . Gout   . Heart murmur    mitral valve prolapse with murmur  . Hiatal hernia   . History of mitral valve prolapse   . History of thyroid cancer March 2009  . Major depression ([place])   . Spinal headache    spinal headaches about 25 years ago    Past Surgical History:  Procedure Laterality Date  . ABDOMINAL HYSTERECTOMY    . APPENDECTOMY     age 44  . CARDIAC CATHETERIZATION  03/08/2003   Dr. Terrence Dupont  .  DILATION AND CURETTAGE OF UTERUS    . REPLACEMENT TOTAL KNEE  06/09/2015  . TOTAL KNEE ARTHROPLASTY Left 06/11/2015   Procedure: LEFT TOTAL KNEE ARTHROPLASTY;  Surgeon: Garald Balding, MD;  Location: [place];  Service: Orthopedics;  Laterality: Left;  . TOTAL THYROIDECTOMY     Radioactive iodine therapy Dr. Chalmers Cater    Social History:  reports that she has never smoked. She has never used smokeless tobacco. She reports that she does not drink alcohol or use drugs.  Allergies:  Allergies  Allergen Reactions  . Codeine Itching  . Penicillins Rash    When she was younger     (Not in a hospital admission)  Blood pressure 125/72, pulse 82, temperature 98.7 F (37.1 C), temperature source Oral, resp. rate 18, height _0  (1.651 m), weight 86.2 kg (190 lb), SpO2 97 %. Physical Exam: General: pleasant, obese white female who is laying in bed in NAD HEENT: head is normocephalic, atraumatic.  Sclera are noninjected. Ears and nose without any masses or lesions.  Mouth is pink and moist Heart: tachycardic, regular rhythm.  No obvious murmurs, gallops, or rubs noted.  Palpable pedal pulses bilaterally Lungs: CTAB, no wheezes, rhonchi, or rales noted.  Respiratory effort nonlabored Abd: soft, NT/ND, +BS, no masses, hernias, or organomegaly MS: all 4 extremities are symmetrical with no cyanosis, clubbing, or edema. Skin: warm and dry with no masses, lesions, or rashes Psych: A&Ox3 with an  appropriate affect. Neuro:  normal speech  Results for orders placed or performed during the hospital encounter of 08/06/15 (from the past 48 hour(s))  Lipase, blood     Status: None   Collection Time: 08/06/15 12:19 PM  Result Value Ref Range   Lipase 26 11 - 51 U/L  Comprehensive metabolic panel     Status: Abnormal   Collection Time: 08/06/15 12:19 PM  Result Value Ref Range   Sodium 139 135 - 145 mmol/L   Potassium 3.2 (L) 3.5 - 5.1 mmol/L   Chloride 107 101 - 111 mmol/L   CO2 20 (L) 22 - 32 mmol/L    Glucose, Bld 122 (H) 65 - 99 mg/dL   BUN 20 6 - 20 mg/dL   Creatinine, Ser 1.56 (H) 0.44 - 1.00 mg/dL   Calcium 9.7 8.9 - 10.3 mg/dL   Total Protein 7.6 6.5 - 8.1 g/dL   Albumin 3.9 3.5 - 5.0 g/dL   AST 34 15 - 41 U/L   ALT 19 14 - 54 U/L   Alkaline Phosphatase 81 38 - 126 U/L   Total Bilirubin 0.3 0.3 - 1.2 mg/dL   GFR calc non Af Amer 32 (L) >60 mL/min   GFR calc Af Amer 37 (L) >60 mL/min    Comment: (NOTE) The eGFR has been calculated using the CKD EPI equation. This calculation has not been validated in all clinical situations. eGFR's persistently <60 mL/min signify possible Chronic Kidney Disease.    Anion gap 12 5 - 15  CBC     Status: Abnormal   Collection Time: 08/06/15 12:19 PM  Result Value Ref Range   WBC 8.4 4.0 - 10.5 K/uL   RBC 5.16 (H) 3.87 - 5.11 MIL/uL   Hemoglobin 14.4 12.0 - 15.0 g/dL   HCT 45.6 36.0 - 46.0 %   MCV 88.4 78.0 - 100.0 fL   MCH 27.9 26.0 - 34.0 pg   MCHC 31.6 30.0 - 36.0 g/dL   RDW 13.7 11.5 - 15.5 %   Platelets 413 (H) 150 - 400 K/uL   US Abdomen Limited  Result Date: 08/06/2015 CLINICAL DATA:  Suspected gallstones from abdominal series of July 24, 2015; patient reports 2 weeks of abdominal pain with nausea EXAM: US ABDOMEN LIMITED - RIGHT UPPER QUADRANT COMPARISON:  Acute abdominal series of July 24, 2015 FINDINGS: Gallbladder: The gallbladder is only partially distended and contains multiple echogenic shadowing stones. There is no gallbladder wall thickening, pericholecystic fluid, or positive sonographic Murphy's sign. Common bile duct: Diameter: 3.2 mm. Liver: The hepatic echotexture is normal. There is no focal mass or ductal dilation. IMPRESSION: Gallstones without sonographic evidence of acute cholecystitis. Normal appearance of the liver and common bile duct. Electronically Signed   By: David  Martinique M.D.   On: 08/06/2015 12:50   Assessment/Plan Symptomatic cholelithiasis  - NPO after MN, IVF, analgesics, antiemetics - laparoscopic  cholecystectomy tomorrow by Dr. Hulen Skains  Diarrhea- c.dif pending  Hypokalemia - 3.2 AKI- likely secondary to dehydration, IVF and check CMP in AM  FEN: IVF NS + K, NPO after MN ID: perioperative abx DVT Proph: SCD's Dispo: lap chole with IOC tomorrow   Risks associated with surgery including, but not limited to, infection, bleeding, damage to surrounding structures, conversion to open, MI, and respiratory compromise discussed in detail with the patient and she wishes to proceed.   Jill Alexanders, [facility] Surgery 08/06/2015, 1:55 PM Pager: [phone] Consults: [phone] Mon-Fri 7:00 am-4:30 pm Sat-Sun 7:00 am-11:30 am

## 2015-08-07 NOTE — Transfer of Care (Signed)
Immediate Anesthesia Transfer of Care Note  Patient: Joanne Tran  Procedure(s) Performed: Procedure(s): LAPAROSCOPIC CHOLECYSTECTOMY (N/A)  Patient Location: PACU  Anesthesia Type:General  Level of Consciousness: awake, alert , oriented and patient cooperative  Airway & Oxygen Therapy: Patient Spontanous Breathing and Patient connected to nasal cannula oxygen  Post-op Assessment: Report given to RN, Post -op Vital signs reviewed and stable and Patient moving all extremities  Post vital signs: Reviewed and stable  Last Vitals:  Vitals:   08/07/15 0226 08/07/15 0612  BP: 132/61 124/66  Pulse: 72 73  Resp: 18 17  Temp: 36.5 C 36.8 C    Last Pain:  Vitals:   08/07/15 0751  TempSrc:   PainSc: 0-No pain         Complications: No apparent anesthesia complications

## 2015-08-07 NOTE — Discharge Instructions (Signed)
Your appointment is at 11:15 AM on 08/21/15, please arrive at least 30 min before your appointment to complete your check in paperwork.  If you are unable to arrive 30 min prior to your appointment time we may have to cancel or reschedule you.  LAPAROSCOPIC SURGERY: POST OP INSTRUCTIONS  1. DIET: Follow a light bland diet the first 24 hours after arrival home, such as soup, liquids, crackers, etc. Be sure to include lots of fluids daily. Avoid fast food or heavy meals as your are more likely to get nauseated. Eat a low fat the next few days after surgery.  2. Take your usually prescribed home medications unless otherwise directed. 3. PAIN CONTROL:  1. Pain is best controlled by a usual combination of three different methods TOGETHER:  1. Ice/Heat 2. Over the counter pain medication 3. Prescription pain medication 2. Most patients will experience some swelling and bruising around the incisions. Ice packs or heating pads (30-60 minutes up to 6 times a day) will help. Use ice for the first few days to help decrease swelling and bruising, then switch to heat to help relax tight/sore spots and speed recovery. Some people prefer to use ice alone, heat alone, alternating between ice & heat. Experiment to what works for you. Swelling and bruising can take several weeks to resolve.  3. It is helpful to take an over-the-counter pain medication regularly for the first few weeks. Choose one of the following that works best for you:  1. Naproxen (Aleve, etc) Two 220mg  tabs twice a day 2. Ibuprofen (Advil, etc) Three 200mg  tabs four times a day (every meal & bedtime) 3. Acetaminophen (Tylenol, etc) 500-650mg  four times a day (every meal & bedtime) 4. A prescription for pain medication (such as oxycodone, hydrocodone, etc) should be given to you upon discharge. Take your pain medication as prescribed.  1. If you are having problems/concerns with the prescription medicine (does not control pain, nausea, vomiting,  rash, itching, etc), please call us [phone] to see if we need to switch you to a different pain medicine that will work better for you and/or control your side effect better. 2. If you need a refill on your pain medication, please contact your pharmacy. They will contact our office to request authorization. Prescriptions will not be filled after 5 pm or on week-ends. 4. Avoid getting constipated. Between the surgery and the pain medications, it is common to experience some constipation. Increasing fluid intake and taking a fiber supplement (such as Metamucil, Citrucel, FiberCon, MiraLax, etc) 1-2 times a day regularly will usually help prevent this problem from occurring. A mild laxative (prune juice, Milk of Magnesia, MiraLax, etc) should be taken according to package directions if there are no bowel movements after 48 hours.  5. Watch out for diarrhea. If you have many loose bowel movements, simplify your diet to bland foods & liquids for a few days. Stop any stool softeners and decrease your fiber supplement. Switching to mild anti-diarrheal medications (Kayopectate, Pepto Bismol) can help. If this worsens or does not improve, please call us. 6. Wash / shower every day. You may shower over the dressings as they are waterproof. Continue to shower over incision(s) after the dressing is off. 7. Remove your waterproof bandages 5 days after surgery. You may leave the incision open to air. You may replace a dressing/Band-Aid to cover the incision for comfort if you wish.  8. ACTIVITIES as tolerated:  1. You may resume regular (light) daily activities beginning the  next day--such as daily self-care, walking, climbing stairs--gradually increasing activities as tolerated. If you can walk 30 minutes without difficulty, it is safe to try more intense activity such as jogging, treadmill, bicycling, low-impact aerobics, swimming, etc. 2. Save the most intensive and strenuous activity for last such as sit-ups,  heavy lifting, contact sports, etc Refrain from any heavy lifting or straining until you are off narcotics for pain control.  3. DO NOT PUSH THROUGH PAIN. Let pain be your guide: If it hurts to do something, don't do it. Pain is your body warning you to avoid that activity for another week until the pain goes down. 4. You may drive when you are no longer taking prescription pain medication, you can comfortably wear a seatbelt, and you can safely maneuver your car and apply brakes. 5. You may have sexual intercourse when it is comfortable.  9. FOLLOW UP in our office  1. Please call [facility] at [phone] to set up an appointment to see your surgeon in the office for a follow-up appointment approximately 2-3 weeks after your surgery. 2. Make sure that you call for this appointment the day you arrive home to insure a convenient appointment time.      10. IF YOU HAVE DISABILITY OR FAMILY LEAVE FORMS, BRING THEM TO THE               OFFICE FOR PROCESSING.   WHEN TO CALL us [phone]:  1. Poor pain control 2. Reactions / problems with new medications (rash/itching, nausea, etc)  3. Fever over 101.5 F (38.5 C) 4. Inability to urinate 5. Nausea and/or vomiting 6. Worsening swelling or bruising 7. Continued bleeding from incision. 8. Increased pain, redness, or drainage from the incision  The clinic staff is available to answer your questions during regular business hours (8:30am-5pm). Please dont hesitate to call and ask to speak to one of our nurses for clinical concerns.  If you have a medical emergency, go to the nearest emergency room or call 911.  A surgeon from [facility] is always on call at the [facility], [place], [place], [place], [place] [address] ?  MAIN: [phone] ? TOLL FREE: [phone] ?  FAX [phone]  [email or web address]

## 2015-08-07 NOTE — Care Management Obs Status (Signed)
[facility] NOTIFICATION   Patient Details  Name: Joanne Tran MRN: [id number] Date of Birth: [birth year]   Medicare Observation Status Notification Given:  Yes    Marilu Favre, RN 08/07/2015, 8:49 AM

## 2015-08-07 NOTE — Interval H&P Note (Signed)
History and Physical Interval Note: Patient without more diarrhea.  Okay for surgery today.  She does not need to be on enteric precautions.  No diarrhea in 24 hours.  [name]. [name], MD, [place] [phone] [place] 08/07/2015 10:51 AM  Joanne Tran  has presented today for surgery, with the diagnosis of Symptomatic Cholelithiasis  The various methods of treatment have been discussed with the patient and family. After consideration of risks, benefits and other options for treatment, the patient has consented to  Procedure(s): LAPAROSCOPIC CHOLECYSTECTOMY WITH INTRAOPERATIVE CHOLANGIOGRAM (N/A) as a surgical intervention .  The patient's history has been reviewed, patient examined, no change in status, stable for surgery.  I have reviewed the patient's chart and labs.  Questions were answered to the patient's satisfaction.     Maurizio Geno

## 2015-08-07 NOTE — Anesthesia Procedure Notes (Signed)
Procedure Name: Intubation Date/Time: 08/07/2015 11:03 AM Performed by: Izora Gala Pre-anesthesia Checklist: Patient identified, Emergency Drugs available, Suction available and Patient being monitored Patient Re-evaluated:Patient Re-evaluated prior to inductionOxygen Delivery Method: Circle system utilized Preoxygenation: Pre-oxygenation with 100% oxygen Intubation Type: IV induction and Rapid sequence Ventilation: Mask ventilation without difficulty Laryngoscope Size: Miller and 3 Grade View: Grade I Tube type: Oral Tube size: 7.0 mm Number of attempts: 1 Airway Equipment and Method: Stylet and Oral airway Placement Confirmation: ETT inserted through vocal cords under direct vision,  positive ETCO2 and breath sounds checked- equal and bilateral Secured at: 22 cm Tube secured with: Tape Dental Injury: Teeth and Oropharynx as per pre-operative assessment

## 2015-08-07 NOTE — Progress Notes (Signed)
Report called to short stay. 

## 2015-08-07 NOTE — Anesthesia Postprocedure Evaluation (Signed)
Anesthesia Post Note  Patient: Joanne Tran  Procedure(s) Performed: Procedure(s) (LRB): LAPAROSCOPIC CHOLECYSTECTOMY (N/A)  Patient location during evaluation: PACU Anesthesia Type: General Level of consciousness: awake and alert Pain management: pain level controlled Vital Signs Assessment: post-procedure vital signs reviewed and stable Respiratory status: spontaneous breathing, nonlabored ventilation, respiratory function stable and patient connected to nasal cannula oxygen Cardiovascular status: blood pressure returned to baseline and stable Postop Assessment: no signs of nausea or vomiting Anesthetic complications: no    Last Vitals:  Vitals:   08/07/15 1300 08/07/15 1316  BP: 132/62 132/63  Pulse: 80 80  Resp: 11 16  Temp: 36.6 C 36.8 C    Last Pain:  Vitals:   08/07/15 1316  TempSrc: Oral  PainSc: 2                  Effie Berkshire

## 2015-08-07 NOTE — Op Note (Signed)
OPERATIVE REPORT  DATE OF OPERATION:  08/07/2015  PATIENT:  Joanne Tran  75 y.o. female  PRE-OPERATIVE DIAGNOSIS:  Symptomatic Cholelithiasis  POST-OPERATIVE DIAGNOSIS:  Symptomatic Cholelithiasis  FINDINGS:  Chronic cholecystitis with cholelithiasis  PROCEDURE:  Procedure(s): LAPAROSCOPIC CHOLECYSTECTOMY  SURGEON:  Surgeon(s): Judeth Horn, MD  ASSISTANT: None  ANESTHESIA:   general  COMPLICATIONS:  None  EBL: <20 ml  BLOOD ADMINISTERED: none  DRAINS: none   SPECIMEN:  Source of Specimen:  Gallbladder and contents  COUNTS CORRECT:  YES  PROCEDURE DETAILS: The patient was taken to the operating room and placed on the table in the supine position.  After an adequate endotracheal anesthetic was administered, the patient was prepped with ChloroPrep, and then draped in the usual manner exposing the entire abdomen laterally, inferiorly and up  to the costal margins.  After a proper timeout was performed including identifying the patient and the procedure to be performed, a supraumbilical 99991111 midline incision was made using a #15 blade.  This was taken down to the fascia which was then incised with a #15 blade.  The edges of the fascia were tented up with Kocher clamps as the preperitoneal space was penetrated with a Kelly clamp into the peritoneum.  Once this was done, a pursestring suture of 0 Vicryl was passed around the fascial opening.  This was subsequently used to secure the [facility] cannula which was passed into the peritoneal cavity.  Once the [facility] cannula was in place, carbon dioxide gas was insufflated into the peritoneal cavity up to a maximal intra-abdominal pressure of 73mm Hg.The laparoscope, with attached camera and light source, was passed into the peritoneal cavity to visualize the direct insertion of two right upper quadrant 37mm cannulas, and a sup-xiphoid 46mm cannula.  Once all cannulas were in place, the dissection was begun.  Two ratcheted graspers were  attached to the dome and infundibulum of the gallbladder and retracted towards the anterior abdominal wall and the right upper quadrant.  Using cautery attached to a dissecting forceps, the peritoneum overlaying the triangle of Chalot and the hepatoduodenal triangle was dissected away exposing the cystic duct and the cystic artery.  A critical window was developed between the CBD and the cystic duct The cystic artery was clipped proximally and distally then transected.  A clip was placed on the gallbladder side of the cystic duct, then the distal cystic duct was clipped multiple times then transected between the clips.  The gallbladder was then dissected out of the hepatic bed without event.  It was retrieved from the abdomen using an Eco-Sac without event.  Once the gallbladder was removed, the bed was inspected for hemostasis.  Once excellent hemostasis was obtained all gas and fluids were aspirated from above the liver, then the cannulas were removed.  The supraumbilical incision was closed using the pursestring suture which was in place.  0.25% bupivicaine with epinephrine was injected at all sites.  All 66mm or greater cannula sites were close using a running subcuticular stitch of 4-0 Monocryl.  5.23mm cannula sites were closed with Dermabond only.Steri-Strips and Tagaderm were used to complete the dressings at all sites.  At this point all needle, sponge, and instrument counts were correct.The patient was awakened from anesthesia and taken to the PACU in stable condition.  PATIENT DISPOSITION:  PACU - hemodynamically stable.   Elkin Belfield 8/2/201711:52 AM

## 2015-08-07 NOTE — Anesthesia Preprocedure Evaluation (Addendum)
Anesthesia Evaluation  Patient identified by MRN, date of birth, ID band Patient awake    Reviewed: Allergy & Precautions, NPO status , Patient's Chart, lab work & pertinent test results, reviewed documented beta blocker date and time   Airway Mallampati: II  TM Distance: >3 FB Neck ROM: Full    Dental  (+) Teeth Intact, Dental Advisory Given   Pulmonary neg pulmonary ROS,    breath sounds clear to auscultation       Cardiovascular hypertension, Pt. on medications and Pt. on home beta blockers  Rhythm:Regular Rate:Normal     Neuro/Psych  Headaches, PSYCHIATRIC DISORDERS Anxiety Depression  Neuromuscular disease    GI/Hepatic Neg liver ROS, hiatal hernia,   Endo/Other  Hypothyroidism   Renal/GU negative Renal ROS  negative genitourinary   Musculoskeletal  (+) Arthritis ,   Abdominal   Peds negative pediatric ROS (+)  Hematology negative hematology ROS (+)   Anesthesia Other Findings   Reproductive/Obstetrics negative OB ROS                            Lab Results  Component Value Date   WBC 4.9 08/07/2015   HGB 11.5 (L) 08/07/2015   HCT 38.0 08/07/2015   MCV 88.8 08/07/2015   PLT 236 08/07/2015   Lab Results  Component Value Date   CREATININE 1.33 (H) 08/07/2015   BUN 17 08/07/2015   NA 142 08/07/2015   K 3.5 08/07/2015   CL 112 (H) 08/07/2015   CO2 23 08/07/2015   Lab Results  Component Value Date   INR 1.01 05/30/2015   INR 0.9 03/25/2007   12/2014 EKG: normal sinus rhythm.    Anesthesia Physical Anesthesia Plan  ASA: II  Anesthesia Plan: General   Post-op Pain Management:    Induction: Intravenous, Rapid sequence and Cricoid pressure planned  Airway Management Planned: Oral ETT  Additional Equipment:   Intra-op Plan:   Post-operative Plan: Extubation in OR  Informed Consent: I have reviewed the patients History and Physical, chart, labs and discussed the  procedure including the risks, benefits and alternatives for the proposed anesthesia with the patient or authorized representative who has indicated his/her understanding and acceptance.   Dental advisory given  Plan Discussed with: CRNA  Anesthesia Plan Comments:        Anesthesia Quick Evaluation

## 2015-08-08 ENCOUNTER — Encounter (HOSPITAL_COMMUNITY): Payer: Self-pay | Admitting: General Surgery

## 2015-08-08 DIAGNOSIS — K8010 Calculus of gallbladder with chronic cholecystitis without obstruction: Secondary | ICD-10-CM | POA: Diagnosis not present

## 2015-08-08 MED ORDER — ONDANSETRON 4 MG PO TBDP: 4.0000 mg | ORAL_TABLET | Freq: Four times a day (QID) | ORAL | 0 refills | Status: DC | PRN

## 2015-08-08 MED ORDER — POLYETHYLENE GLYCOL 3350 17 G PO PACK: 17.0000 g | PACK | Freq: Every day | ORAL | 0 refills | Status: DC | PRN

## 2015-08-08 MED ORDER — OXYCODONE HCL 5 MG PO TABS
5.0000 mg | ORAL_TABLET | Freq: Four times a day (QID) | ORAL | 0 refills | Status: DC | PRN
Start: 2015-08-08 — End: 2015-08-08

## 2015-08-08 MED ORDER — SIMETHICONE 80 MG PO CHEW: 40.0000 mg | CHEWABLE_TABLET | Freq: Four times a day (QID) | ORAL | 0 refills | Status: DC | PRN

## 2015-08-08 MED ORDER — ONDANSETRON HCL 4 MG PO TABS: 4.0000 mg | ORAL_TABLET | Freq: Three times a day (TID) | ORAL | 5 refills | Status: DC | PRN

## 2015-08-08 NOTE — Discharge Summary (Signed)
[facility] Discharge Summary   Patient ID: Joanne Tran MRN: [id number] DOB/AGE: [birth year] 73 y.o.  Admit date: 08/06/2015 Discharge date: 08/08/2015  Admitting Diagnosis: Symptomatic cholelithiasis  Discharge Diagnosis Patient Active Problem List   Diagnosis Date Noted  . Symptomatic cholelithiasis 08/06/2015  . Primary osteoarthritis of left knee 06/11/2015  . S/P total knee replacement using cement 06/11/2015  . MITRAL VALVE PROLAPSE 11/16/2007  . HIATAL HERNIA 11/16/2007  . COUGH 11/16/2007  . THYROID CANCER, HX OF 11/16/2007    Consultants None  Imaging: Ct Abdomen Pelvis W Contrast  Result Date: 08/06/2015 CLINICAL DATA:  Left lower quadrant abdominal pain with nausea and diarrhea for 2 weeks. EXAM: CT ABDOMEN AND PELVIS WITH CONTRAST TECHNIQUE: Multidetector CT imaging of the abdomen and pelvis was performed using the standard protocol following bolus administration of intravenous contrast. CONTRAST:  49mL ISOVUE-300 IOPAMIDOL (ISOVUE-300) INJECTION 61% COMPARISON:  None. FINDINGS: Lower chest: Large hiatal hernia which contains the stomach, significant portion of the duodenum and pancreas. Probable chronic atelectasis at the overlying left lung base. Hepatobiliary: Large stone within the otherwise normal-appearing gallbladder. Liver appears normal. Pancreas: No mass, inflammatory changes, or other significant abnormality. Herniated into the lower mediastinum at the hiatal hernia, as above. Spleen: Within normal limits in size and appearance. Adrenals/Urinary Tract: Hypodense mass within the right adrenal gland measures 1.8 cm greatest dimension, with CT density measurements of 45-50 Hounsfield units. Left adrenal gland appears normal. Bilateral renal cysts. No renal stone or hydronephrosis bilaterally. No ureteral or bladder calculi identified. Bladder is decompressed. Stomach/Bowel: Bowel is normal in caliber. No bowel wall thickening or evidence of bowel wall  inflammation seen. Mild diverticulosis of the sigmoid colon without evidence of acute diverticulitis. Status post appendectomy. Vascular/Lymphatic: Scattered atherosclerotic changes of the normal caliber abdominal aorta. No enlarged lymph nodes seen within the abdomen or pelvis. Reproductive: Status post hysterectomy. No mass or inflammatory change within either adnexal region. Other: No free fluid or abscess collection. No free intraperitoneal air. Musculoskeletal: Degenerative changes of the thoracolumbar spine, mild to moderate in degree. No acute or suspicious osseous finding. Superficial soft tissues are unremarkable. IMPRESSION: 1. No acute findings. No bowel obstruction or evidence of bowel wall inflammation. No free fluid or abscess. No evidence of acute solid organ abnormality. No renal or ureteral calculi. 2. Large hiatal hernia containing the stomach, duodenum and pancreas. 3. Cholelithiasis without evidence of acute cholecystitis. 4. Right adrenal mass measuring 1.8 cm, not able to be confirmed as a benign adrenal adenoma by CT density measurements. Consider nonemergent adrenal protocol MRI to ensure benignity. 5. Colonic diverticulosis without evidence of acute diverticulitis. 6. Aortic atherosclerosis. Electronically Signed   By: Franki Cabot M.D.   On: 08/06/2015 14:20   US Abdomen Limited  Result Date: 08/06/2015 CLINICAL DATA:  Suspected gallstones from abdominal series of July 24, 2015; patient reports 2 weeks of abdominal pain with nausea EXAM: US ABDOMEN LIMITED - RIGHT UPPER QUADRANT COMPARISON:  Acute abdominal series of July 24, 2015 FINDINGS: Gallbladder: The gallbladder is only partially distended and contains multiple echogenic shadowing stones. There is no gallbladder wall thickening, pericholecystic fluid, or positive sonographic Murphy's sign. Common bile duct: Diameter: 3.2 mm. Liver: The hepatic echotexture is normal. There is no focal mass or ductal dilation. IMPRESSION:  Gallstones without sonographic evidence of acute cholecystitis. Normal appearance of the liver and common bile duct. Electronically Signed   By: David  Martinique M.D.   On: 08/06/2015 12:50    Procedures Dr. Judeth Horn (  08/07/15) - Laparoscopic Cholecystectomy with [facility]  Hospital Course:  75 year old female who presented to [facility] with worsening abdominal pain in the setting ok known gallstones and hiatal hernia. Associated sxs included nausea and10-14 days of watery, non-bloody diarrhea. Physical exam was significant for RUQ tenderness and a positive murphy's sign.  Patient was admitted and underwent procedure listed above. Tolerated procedure well and was transferred to the floor.  Diet was advanced as tolerated.  On POD#1, the patient was voiding well, tolerating diet, ambulating well, pain well controlled, vital signs stable, incisions c/d/i and felt stable for discharge home.  Patient will follow up in our office in 2 weeks and knows to call with questions or concerns.   Physical Exam: General:  Alert, NAD, pleasant, comfortable Abd:  Soft, ND, appropriately tender, +BS, incisions C/D/I    Medication List    STOP taking these medications   methocarbamol 500 MG tablet Commonly known as:  ROBAXIN     TAKE these medications   acetaminophen 325 MG tablet Commonly known as:  TYLENOL Take 2 tablets (650 mg total) by mouth every 6 (six) hours as needed.   atenolol 50 MG tablet Commonly known as:  TENORMIN Take 50 mg by mouth daily.   colchicine 0.6 MG tablet Take 1 tablet (0.6 mg total) by mouth 2 (two) times daily as needed (FOR GOUT ATTACK).   hydrochlorothiazide 25 MG tablet Commonly known as:  HYDRODIURIL Take 25 mg by mouth daily.   levothyroxine 88 MCG tablet Commonly known as:  SYNTHROID, LEVOTHROID Take 88 mcg by mouth daily before breakfast.   lisinopril 40 MG tablet Commonly known as:  PRINIVIL,ZESTRIL Take 40 mg by mouth daily.   ondansetron 4 MG disintegrating  tablet Commonly known as:  ZOFRAN-ODT Take 1 tablet (4 mg total) by mouth every 6 (six) hours as needed for nausea.   oxyCODONE 5 MG immediate release tablet Commonly known as:  Oxy IR/ROXICODONE Take 1-2 tablets (5-10 mg total) by mouth every 6 (six) hours as needed for moderate pain, severe pain or breakthrough pain. What changed:  when to take this   polyethylene glycol packet Commonly known as:  MIRALAX / GLYCOLAX Take 17 g by mouth daily as needed for mild constipation.   PROZAC 20 MG capsule Generic drug:  FLUoxetine Take 20 mg by mouth daily.   rivaroxaban 10 MG Tabs tablet Commonly known as:  XARELTO Take 1 tablet (10 mg total) by mouth daily with breakfast.   simethicone 80 MG chewable tablet Commonly known as:  MYLICON Chew 0.5 tablets (40 mg total) by mouth every 6 (six) hours as needed for flatulence (bloating).      [facility]up [facility] Follow up on 08/21/2015.   Specialty:  General Surgery Why:  Your appointment is at 11:15 AM. please arrive 30 minutes early to get checked in and fill out any necessary paperwork.  Contact information: [address] [place] [phone]          Signed: Obie Dredge, [facility] 08/08/2015, 9:29 AM Pager: [phone] Consults: [phone] Mon-Fri 7:00 am-4:30 pm Sat-Sun 7:00 am-11:30 am

## 2015-08-08 NOTE — Progress Notes (Signed)
Discharge home. Home discharge instruction given, no question verbalized. 

## 2015-08-09 ENCOUNTER — Other Ambulatory Visit: Payer: Medicare Other

## 2015-08-12 ENCOUNTER — Other Ambulatory Visit (HOSPITAL_COMMUNITY): Payer: Self-pay | Admitting: Surgery

## 2015-08-12 DIAGNOSIS — Z85850 Personal history of malignant neoplasm of thyroid: Secondary | ICD-10-CM | POA: Diagnosis not present

## 2015-08-12 DIAGNOSIS — Z9049 Acquired absence of other specified parts of digestive tract: Secondary | ICD-10-CM | POA: Diagnosis not present

## 2015-08-12 DIAGNOSIS — R1032 Left lower quadrant pain: Secondary | ICD-10-CM | POA: Diagnosis not present

## 2015-08-12 DIAGNOSIS — Z79899 Other long term (current) drug therapy: Secondary | ICD-10-CM | POA: Diagnosis not present

## 2015-08-12 DIAGNOSIS — F329 Major depressive disorder, single episode, unspecified: Secondary | ICD-10-CM | POA: Diagnosis not present

## 2015-08-12 DIAGNOSIS — K449 Diaphragmatic hernia without obstruction or gangrene: Secondary | ICD-10-CM | POA: Diagnosis not present

## 2015-08-12 DIAGNOSIS — K529 Noninfective gastroenteritis and colitis, unspecified: Secondary | ICD-10-CM | POA: Diagnosis not present

## 2015-08-12 DIAGNOSIS — J9811 Atelectasis: Secondary | ICD-10-CM | POA: Diagnosis not present

## 2015-08-12 DIAGNOSIS — Z9889 Other specified postprocedural states: Secondary | ICD-10-CM | POA: Diagnosis not present

## 2015-08-12 DIAGNOSIS — R112 Nausea with vomiting, unspecified: Secondary | ICD-10-CM | POA: Diagnosis not present

## 2015-08-12 DIAGNOSIS — K8020 Calculus of gallbladder without cholecystitis without obstruction: Secondary | ICD-10-CM

## 2015-08-12 DIAGNOSIS — I10 Essential (primary) hypertension: Secondary | ICD-10-CM | POA: Diagnosis not present

## 2015-08-15 DIAGNOSIS — R1032 Left lower quadrant pain: Secondary | ICD-10-CM | POA: Diagnosis not present

## 2015-08-15 DIAGNOSIS — A09 Infectious gastroenteritis and colitis, unspecified: Secondary | ICD-10-CM | POA: Diagnosis not present

## 2015-08-15 DIAGNOSIS — R110 Nausea: Secondary | ICD-10-CM | POA: Diagnosis not present

## 2015-08-16 ENCOUNTER — Encounter: Payer: Self-pay | Admitting: Physician Assistant

## 2015-08-16 DIAGNOSIS — A09 Infectious gastroenteritis and colitis, unspecified: Secondary | ICD-10-CM | POA: Diagnosis not present

## 2015-08-21 DIAGNOSIS — M79672 Pain in left foot: Secondary | ICD-10-CM | POA: Diagnosis not present

## 2015-08-21 DIAGNOSIS — M79671 Pain in right foot: Secondary | ICD-10-CM | POA: Diagnosis not present

## 2015-08-21 DIAGNOSIS — M1A00X0 Idiopathic chronic gout, unspecified site, without tophus (tophi): Secondary | ICD-10-CM | POA: Diagnosis not present

## 2015-09-24 ENCOUNTER — Encounter: Payer: Self-pay | Admitting: Physician Assistant

## 2015-11-13 ENCOUNTER — Encounter (INDEPENDENT_AMBULATORY_CARE_PROVIDER_SITE_OTHER): Payer: Self-pay | Admitting: Orthopaedic Surgery

## 2015-11-13 ENCOUNTER — Ambulatory Visit (INDEPENDENT_AMBULATORY_CARE_PROVIDER_SITE_OTHER): Payer: Medicare Other | Admitting: Orthopaedic Surgery

## 2015-11-13 VITALS — BP 125/66 | HR 60 | Resp 14 | Ht 65.0 in | Wt 179.0 lb

## 2015-11-13 DIAGNOSIS — M1711 Unilateral primary osteoarthritis, right knee: Secondary | ICD-10-CM | POA: Diagnosis not present

## 2015-11-13 MED ORDER — METHYLPREDNISOLONE ACETATE 40 MG/ML IJ SUSP
80.0000 mg | INTRAMUSCULAR | Status: AC | PRN
  Administered 2015-11-13: 80 mg

## 2015-11-13 MED ORDER — LIDOCAINE HCL 1 % IJ SOLN
5.0000 mL | INTRAMUSCULAR | Status: AC | PRN
  Administered 2015-11-13: 5 mL

## 2015-11-13 NOTE — Progress Notes (Signed)
Office Visit Note   Patient: Joanne Tran           Date of Birth: Jun 03, 1940           MRN: [id number] Visit Date: 11/13/2015              Requested by: Wallene Huh, MD [address] [place], [place] [address] PCP: Patricia Nettle, MD   Assessment & Plan: Visit Diagnoses:  1. Unilateral primary osteoarthritis, right knee     Plan:   Follow-Up Instructions: Return in about 6 months (around 05/12/2016) for knee pain.   Orders:  Orders Placed This Encounter  Procedures  . Large Joint Injection/Arthrocentesis   No orders of the defined types were placed in this encounter.     Procedures: No procedures performed   Clinical Data: No additional findings.   Subjective: Chief Complaint  Patient presents with  . Right Knee - Pain   Joanne Tran had a left knee replacement in June and doing quite well. She is very happy with the results and not experiencing any significant discomfort. She continues to work with her exercises.  In the interimHPI she has developed right knee pain consistent. diagnosed osteoarthritis   Review of Systems   Objective: Vital Signs: BP 125/66   Pulse 60   Resp 14   Ht 5\' 5"  (1.651 m)   Wt 179 lb (81.2 kg)   BMI 29.79 kg/m   Physical Exam  Ortho Exam the right knee had a very minimal effusion with predominantly medial joint pain lacked a few degrees to full extension there was no evidence of instability.  I plan to inject the right knee with cortisone and have her follow up in 6 months thanks  Specialty Comments:  No specialty comments available.  Imaging: No results found.   PMFS History: Patient Active Problem List   Diagnosis Date Noted  . Symptomatic cholelithiasis 08/06/2015  . Primary osteoarthritis of left knee 06/11/2015  . S/P total knee replacement using cement 06/11/2015  . MITRAL VALVE PROLAPSE 11/16/2007  . HIATAL HERNIA 11/16/2007  . COUGH 11/16/2007  . THYROID CANCER, HX OF 11/16/2007   Past  Medical History:  Diagnosis Date  . Anxiety    uses prozac  . Arthritis    "both knees" (08/06/2015)  . Cervical spondylosis   . Essential hypertension   . Gout   . Heart murmur    mitral valve prolapse with murmur  . Hiatal hernia   . History of mitral valve prolapse   . Hypertension   . Hypothyroidism   . Major depression   . Spinal headache 1970s   spinal headaches when they removed rectal polyps; "they had done a spinal tap"  . Thyroid cancer ([place]) 03/2007    Family History  Problem Relation Age of Onset  . Heart disease Father   . Heart attack Father   . Heart disease Mother   . Heart attack Mother   . Ovarian cancer Sister   . Heart disease Paternal Uncle     CABG    Past Surgical History:  Procedure Laterality Date  . ABDOMINAL HYSTERECTOMY  ~ 1975  . APPENDECTOMY  1950  . CARDIAC CATHETERIZATION  03/08/2003   Dr. Terrence Dupont  . CHOLECYSTECTOMY N/A 08/07/2015   Procedure: LAPAROSCOPIC CHOLECYSTECTOMY;  Surgeon: Judeth Horn, MD;  Location: [facility];  Service: General;  Laterality: N/A;  . DILATION AND CURETTAGE OF UTERUS  multiple  . JOINT REPLACEMENT    . TONSILLECTOMY  1950s  . TOTAL KNEE ARTHROPLASTY Left 06/11/2015   Procedure: LEFT TOTAL KNEE ARTHROPLASTY;  Surgeon: Garald Balding, MD;  Location: [facility] [facility];  Service: Orthopedics;  Laterality: Left;  . TOTAL THYROIDECTOMY  03/2007   Radioactive iodine therapy Dr. Chalmers Cater   Social History   Occupational History  . Not on file.   Social History Main Topics  . Smoking status: Never Smoker  . Smokeless tobacco: Never Used  . Alcohol use No  . Drug use: No  . Sexual activity: No

## 2015-11-13 NOTE — Progress Notes (Signed)
Pt states she has centralized pain in her Right knee and would like an injection. Pt denies no injury, no numbness but does have minimal swelling.  Pt requests an injection, last one over a year ago. End stage OA  Pt had L TKA on 06/11/15.

## 2015-11-13 NOTE — Progress Notes (Signed)
   Procedure Note  Patient: Joanne Tran             Date of Birth: 08-22-1940           MRN: [id number]             Visit Date: 11/13/2015  Procedures: Visit Diagnoses: No diagnosis found.  Large Joint Inj Date/Time: 11/13/2015 3:26 PM Performed by: Garald Balding Authorized by: Garald Balding   Consent Given by:  Patient Timeout: prior to procedure the correct patient, procedure, and site was verified   Indications:  Pain and joint swelling Location:  Knee Site:  R knee Prep: patient was prepped and draped in usual sterile fashion   Needle Size:  25 G Needle Length:  1.5 inches Approach:  Anteromedial Ultrasound Guidance: No   Fluoroscopic Guidance: No   Arthrogram: No Medications:  5 mL lidocaine 1 %; 80 mg methylPREDNISolone acetate 40 MG/ML Aspiration Attempted: No   Patient tolerance:  Patient tolerated the procedure well with no immediate complications

## 2015-11-14 DIAGNOSIS — Z6832 Body mass index (BMI) 32.0-32.9, adult: Secondary | ICD-10-CM | POA: Diagnosis not present

## 2015-11-14 DIAGNOSIS — Z1231 Encounter for screening mammogram for malignant neoplasm of breast: Secondary | ICD-10-CM | POA: Diagnosis not present

## 2015-11-14 DIAGNOSIS — Z01419 Encounter for gynecological examination (general) (routine) without abnormal findings: Secondary | ICD-10-CM | POA: Diagnosis not present

## 2015-11-19 DIAGNOSIS — E669 Obesity, unspecified: Secondary | ICD-10-CM | POA: Diagnosis not present

## 2015-11-19 DIAGNOSIS — N289 Disorder of kidney and ureter, unspecified: Secondary | ICD-10-CM | POA: Diagnosis not present

## 2016-02-18 DIAGNOSIS — C73 Malignant neoplasm of thyroid gland: Secondary | ICD-10-CM | POA: Diagnosis not present

## 2016-02-18 DIAGNOSIS — E669 Obesity, unspecified: Secondary | ICD-10-CM | POA: Diagnosis not present

## 2016-02-18 DIAGNOSIS — E890 Postprocedural hypothyroidism: Secondary | ICD-10-CM | POA: Diagnosis not present

## 2016-02-18 DIAGNOSIS — N289 Disorder of kidney and ureter, unspecified: Secondary | ICD-10-CM | POA: Diagnosis not present

## 2016-02-25 DIAGNOSIS — C73 Malignant neoplasm of thyroid gland: Secondary | ICD-10-CM | POA: Diagnosis not present

## 2016-02-25 DIAGNOSIS — E890 Postprocedural hypothyroidism: Secondary | ICD-10-CM | POA: Diagnosis not present

## 2016-04-09 DIAGNOSIS — K449 Diaphragmatic hernia without obstruction or gangrene: Secondary | ICD-10-CM | POA: Diagnosis not present

## 2016-04-09 DIAGNOSIS — M109 Gout, unspecified: Secondary | ICD-10-CM | POA: Diagnosis not present

## 2016-04-09 DIAGNOSIS — I10 Essential (primary) hypertension: Secondary | ICD-10-CM | POA: Diagnosis not present

## 2016-04-09 DIAGNOSIS — F329 Major depressive disorder, single episode, unspecified: Secondary | ICD-10-CM | POA: Diagnosis not present

## 2016-04-09 DIAGNOSIS — Z299 Encounter for prophylactic measures, unspecified: Secondary | ICD-10-CM | POA: Diagnosis not present

## 2016-04-09 DIAGNOSIS — Z6832 Body mass index (BMI) 32.0-32.9, adult: Secondary | ICD-10-CM | POA: Diagnosis not present

## 2016-04-09 DIAGNOSIS — E039 Hypothyroidism, unspecified: Secondary | ICD-10-CM | POA: Diagnosis not present

## 2016-04-28 DIAGNOSIS — E890 Postprocedural hypothyroidism: Secondary | ICD-10-CM | POA: Diagnosis not present

## 2016-05-13 ENCOUNTER — Encounter (INDEPENDENT_AMBULATORY_CARE_PROVIDER_SITE_OTHER): Payer: Self-pay | Admitting: Orthopaedic Surgery

## 2016-05-13 ENCOUNTER — Ambulatory Visit (INDEPENDENT_AMBULATORY_CARE_PROVIDER_SITE_OTHER): Payer: Medicare Other | Admitting: Orthopaedic Surgery

## 2016-05-13 DIAGNOSIS — Z96652 Presence of left artificial knee joint: Secondary | ICD-10-CM

## 2016-05-13 NOTE — Progress Notes (Signed)
Office Visit Note   Patient: Joanne Tran           Date of Birth: Sep 13, 1940           MRN: [id number] Visit Date: 05/13/2016              Requested by: Wallene Huh, [address] [place], [place] [address] PCP: Wallene Huh, MD   Assessment & Plan: Visit Diagnoses:  1. History of left knee replacement    11 months months status post primary left total knee replacement and doing very well. Joanne Tran is very happy with her results and not having any complaints of pain Plan: Encourage exercises for strengthening quadriceps and hamstring musculature. Office 1 year. Long discussion regarding activity level and exercising  Follow-Up Instructions: Return in about 1 year (around 05/13/2017).   Orders:  No orders of the defined types were placed in this encounter.  No orders of the defined types were placed in this encounter.     Procedures: No procedures performed   Clinical Data: No additional findings.   Subjective: Chief Complaint  Patient presents with  . Right Knee - Routine Post Op    Joanne Tran is approximately 1 yr status post right knee surgery  No complaints of pain fever or chills. No use of ambulatory aid.  HPI  Review of Systems   Objective: Vital Signs: There were no vitals taken for this visit.  Physical Exam  Ortho Exam left knee with no effusion. No opening with a varus or valgus stress. Negative anterior drawer sign. Full extension over 100 of flexion. No calf pain. No popliteal mass or discomfort. Neurovascular exam intact distally no swelling. Straight leg raise negative bilaterally. No pain with range of motion of left hip  Specialty Comments:  No specialty comments available.  Imaging: No results found.   PMFS History: Patient Active Problem List   Diagnosis Date Noted  . Symptomatic cholelithiasis 08/06/2015  . Primary osteoarthritis of left knee 06/11/2015  . S/P total knee replacement using cement 06/11/2015    . MITRAL VALVE PROLAPSE 11/16/2007  . HIATAL HERNIA 11/16/2007  . COUGH 11/16/2007  . THYROID CANCER, HX OF 11/16/2007   Past Medical History:  Diagnosis Date  . Anxiety    uses prozac  . Arthritis    "both knees" (08/06/2015)  . Cervical spondylosis   . Essential hypertension   . Gout   . Heart murmur    mitral valve prolapse with murmur  . Hiatal hernia   . History of mitral valve prolapse   . Hypertension   . Hypothyroidism   . Major depression   . Spinal headache 1970s   spinal headaches when they removed rectal polyps; "they had done a spinal tap"  . Thyroid cancer ([place]) 03/2007    Family History  Problem Relation Age of Onset  . Heart disease Father   . Heart attack Father   . Heart disease Mother   . Heart attack Mother   . Ovarian cancer Sister   . Heart disease Paternal Uncle     CABG    Past Surgical History:  Procedure Laterality Date  . ABDOMINAL HYSTERECTOMY  ~ 1975  . APPENDECTOMY  1950  . CARDIAC CATHETERIZATION  03/08/2003   Dr. Terrence Dupont  . CHOLECYSTECTOMY N/A 08/07/2015   Procedure: LAPAROSCOPIC CHOLECYSTECTOMY;  Surgeon: Judeth Horn, MD;  Location: [facility];  Service: General;  Laterality: N/A;  . DILATION AND CURETTAGE OF UTERUS  multiple  .  JOINT REPLACEMENT    . TONSILLECTOMY  1950s  . TOTAL KNEE ARTHROPLASTY Left 06/11/2015   Procedure: LEFT TOTAL KNEE ARTHROPLASTY;  Surgeon: Garald Balding, MD;  Location: [place];  Service: Orthopedics;  Laterality: Left;  . TOTAL THYROIDECTOMY  03/2007   Radioactive iodine therapy Dr. Chalmers Cater   Social History   Occupational History  . Not on file.   Social History Main Topics  . Smoking status: Never Smoker  . Smokeless tobacco: Never Used  . Alcohol use No  . Drug use: No  . Sexual activity: No     Garald Balding, MD   Note - This record has been created using [employer].  Chart creation errors have been sought, but may not always  have been located. Such creation errors do not reflect on   the standard of medical care.

## 2016-06-30 DIAGNOSIS — C73 Malignant neoplasm of thyroid gland: Secondary | ICD-10-CM | POA: Diagnosis not present

## 2016-06-30 DIAGNOSIS — E890 Postprocedural hypothyroidism: Secondary | ICD-10-CM | POA: Diagnosis not present

## 2016-07-09 DIAGNOSIS — F329 Major depressive disorder, single episode, unspecified: Secondary | ICD-10-CM | POA: Diagnosis not present

## 2016-07-09 DIAGNOSIS — I10 Essential (primary) hypertension: Secondary | ICD-10-CM | POA: Diagnosis not present

## 2016-07-09 DIAGNOSIS — Z299 Encounter for prophylactic measures, unspecified: Secondary | ICD-10-CM | POA: Diagnosis not present

## 2016-07-09 DIAGNOSIS — Z6833 Body mass index (BMI) 33.0-33.9, adult: Secondary | ICD-10-CM | POA: Diagnosis not present

## 2016-07-09 DIAGNOSIS — E039 Hypothyroidism, unspecified: Secondary | ICD-10-CM | POA: Diagnosis not present

## 2016-09-01 DIAGNOSIS — E890 Postprocedural hypothyroidism: Secondary | ICD-10-CM | POA: Diagnosis not present

## 2016-09-16 DIAGNOSIS — H25813 Combined forms of age-related cataract, bilateral: Secondary | ICD-10-CM | POA: Diagnosis not present

## 2016-09-16 DIAGNOSIS — H52203 Unspecified astigmatism, bilateral: Secondary | ICD-10-CM | POA: Diagnosis not present

## 2016-09-16 DIAGNOSIS — H43813 Vitreous degeneration, bilateral: Secondary | ICD-10-CM | POA: Diagnosis not present

## 2016-09-16 DIAGNOSIS — H527 Unspecified disorder of refraction: Secondary | ICD-10-CM | POA: Diagnosis not present

## 2016-10-14 ENCOUNTER — Encounter (INDEPENDENT_AMBULATORY_CARE_PROVIDER_SITE_OTHER): Payer: Self-pay | Admitting: Orthopaedic Surgery

## 2016-10-14 ENCOUNTER — Ambulatory Visit (INDEPENDENT_AMBULATORY_CARE_PROVIDER_SITE_OTHER): Payer: Medicare Other | Admitting: Orthopaedic Surgery

## 2016-10-14 VITALS — BP 142/88 | HR 70 | Resp 12 | Ht 63.0 in | Wt 185.0 lb

## 2016-10-14 DIAGNOSIS — M1711 Unilateral primary osteoarthritis, right knee: Secondary | ICD-10-CM

## 2016-10-14 MED ORDER — METHYLPREDNISOLONE ACETATE 40 MG/ML IJ SUSP
80.0000 mg | INTRAMUSCULAR | Status: AC | PRN
  Administered 2016-10-14: 80 mg

## 2016-10-14 MED ORDER — BUPIVACAINE HCL 0.5 % IJ SOLN
3.0000 mL | INTRAMUSCULAR | Status: AC | PRN
  Administered 2016-10-14: 3 mL via INTRA_ARTICULAR

## 2016-10-14 MED ORDER — LIDOCAINE HCL 1 % IJ SOLN
5.0000 mL | INTRAMUSCULAR | Status: AC | PRN
  Administered 2016-10-14: 5 mL

## 2016-10-14 NOTE — Progress Notes (Signed)
Office Visit Note   Patient: Joanne Tran           Date of Birth: 04/07/1940           MRN: [id number] Visit Date: 10/14/2016              Requested by: Wallene Huh, [address] [place], [place] [address] PCP: Wallene Huh, MD   Assessment & Plan: Visit Diagnoses:  1. Unilateral primary osteoarthritis, right knee     Plan: Cortisone injection right knee. Follow up as needed  Follow-Up Instructions: Return if symptoms worsen or fail to improve.   Orders:  No orders of the defined types were placed in this encounter.  No orders of the defined types were placed in this encounter.     Procedures: Large Joint Inj Date/Time: 10/14/2016 1:47 PM Performed by: Garald Balding Authorized by: Garald Balding   Consent Given by:  Patient Timeout: prior to procedure the correct patient, procedure, and site was verified   Indications:  Pain and joint swelling Location:  Knee Site:  R knee Prep: patient was prepped and draped in usual sterile fashion   Needle Size:  25 G Needle Length:  1.5 inches Approach:  Anteromedial Ultrasound Guidance: No   Fluoroscopic Guidance: No   Arthrogram: No   Medications:  5 mL lidocaine 1 %; 80 mg methylPREDNISolone acetate 40 MG/ML; 3 mL bupivacaine 0.5 % Aspiration Attempted: No   Patient tolerance:  Patient tolerated the procedure well with no immediate complications     Clinical Data: No additional findings.   Subjective: No chief complaint on file. Joanne Tran has a history of bilateral knee osteoarthritis. She's had a prior left total knee replacement without complications. She's had recurrent pain in her right knee to the point where it's a "nuisance". Pain is predominantly along the medial aspect of her knee associate was soreness and achiness. No fever or chills. No history of shortness of breath or chest pain.  HPI  Review of Systems  Constitutional: Positive for fatigue. Negative for chills and  fever.  Eyes: Negative for itching.  Respiratory: Negative for chest tightness and shortness of breath.   Cardiovascular: Negative for chest pain, palpitations and leg swelling.  Gastrointestinal: Negative for blood in stool, constipation and diarrhea.  Endocrine: Negative for polyuria.  Genitourinary: Negative for dysuria.  Musculoskeletal: Negative for back pain, joint swelling, neck pain and neck stiffness.  Allergic/Immunologic: Negative for immunocompromised state.  Neurological: Positive for weakness. Negative for dizziness and numbness.  Hematological: Does not bruise/bleed easily.  Psychiatric/Behavioral: The patient is not nervous/anxious.      Objective: Vital Signs: BP (!) 142/88   Pulse 70   Resp 12   Ht 5\' 3"  (1.6 m)   Wt 185 lb (83.9 kg)   BMI 32.77 kg/m   Physical Exam  Ortho Exam awake alert and oriented 3. Comfortable sitting . No effusion right knee. Full extension and flexion over 100 without instability. No calf pain or distal edema. Neurovascular exam intact. Medial joint pain today No specialty comments available.  Imaging: No results found.   PMFS History: Patient Active Problem List   Diagnosis Date Noted  . Symptomatic cholelithiasis 08/06/2015  . Primary osteoarthritis of left knee 06/11/2015  . S/P total knee replacement using cement 06/11/2015  . MITRAL VALVE PROLAPSE 11/16/2007  . HIATAL HERNIA 11/16/2007  . COUGH 11/16/2007  . THYROID CANCER, HX OF 11/16/2007   Past Medical History:  Diagnosis Date  .  Anxiety    uses prozac  . Arthritis    "both knees" (08/06/2015)  . Cervical spondylosis   . Essential hypertension   . Gout   . Heart murmur    mitral valve prolapse with murmur  . Hiatal hernia   . History of mitral valve prolapse   . Hypertension   . Hypothyroidism   . Major depression   . Spinal headache 1970s   spinal headaches when they removed rectal polyps; "they had done a spinal tap"  . Thyroid cancer ([place]) 03/2007      Family History  Problem Relation Age of Onset  . Heart disease Father   . Heart attack Father   . Heart disease Mother   . Heart attack Mother   . Ovarian cancer Sister   . Heart disease Paternal Uncle        CABG    Past Surgical History:  Procedure Laterality Date  . ABDOMINAL HYSTERECTOMY  ~ 1975  . APPENDECTOMY  1950  . CARDIAC CATHETERIZATION  03/08/2003   Dr. Terrence Dupont  . CHOLECYSTECTOMY N/A 08/07/2015   Procedure: LAPAROSCOPIC CHOLECYSTECTOMY;  Surgeon: Judeth Horn, MD;  Location: [place];  Service: General;  Laterality: N/A;  . DILATION AND CURETTAGE OF UTERUS  multiple  . JOINT REPLACEMENT    . TONSILLECTOMY  1950s  . TOTAL KNEE ARTHROPLASTY Left 06/11/2015   Procedure: LEFT TOTAL KNEE ARTHROPLASTY;  Surgeon: Garald Balding, MD;  Location: [place] [facility];  Service: Orthopedics;  Laterality: Left;  . TOTAL THYROIDECTOMY  03/2007   Radioactive iodine therapy Dr. Chalmers Cater   Social History   Occupational History  . Not on file.   Social History Main Topics  . Smoking status: Never Smoker  . Smokeless tobacco: Never Used  . Alcohol use No  . Drug use: No  . Sexual activity: No

## 2016-10-19 DIAGNOSIS — F329 Major depressive disorder, single episode, unspecified: Secondary | ICD-10-CM | POA: Diagnosis not present

## 2016-10-19 DIAGNOSIS — E039 Hypothyroidism, unspecified: Secondary | ICD-10-CM | POA: Diagnosis not present

## 2016-10-19 DIAGNOSIS — I10 Essential (primary) hypertension: Secondary | ICD-10-CM | POA: Diagnosis not present

## 2016-10-19 DIAGNOSIS — Z299 Encounter for prophylactic measures, unspecified: Secondary | ICD-10-CM | POA: Diagnosis not present

## 2016-10-19 DIAGNOSIS — Z6834 Body mass index (BMI) 34.0-34.9, adult: Secondary | ICD-10-CM | POA: Diagnosis not present

## 2016-11-03 DIAGNOSIS — E890 Postprocedural hypothyroidism: Secondary | ICD-10-CM | POA: Diagnosis not present

## 2016-11-05 DIAGNOSIS — Z23 Encounter for immunization: Secondary | ICD-10-CM | POA: Diagnosis not present

## 2016-11-24 DIAGNOSIS — Z1231 Encounter for screening mammogram for malignant neoplasm of breast: Secondary | ICD-10-CM | POA: Diagnosis not present

## 2016-11-24 DIAGNOSIS — Z01419 Encounter for gynecological examination (general) (routine) without abnormal findings: Secondary | ICD-10-CM | POA: Diagnosis not present

## 2016-11-24 DIAGNOSIS — Z6833 Body mass index (BMI) 33.0-33.9, adult: Secondary | ICD-10-CM | POA: Diagnosis not present

## 2016-12-02 ENCOUNTER — Other Ambulatory Visit: Payer: Self-pay | Admitting: Obstetrics and Gynecology

## 2016-12-02 DIAGNOSIS — R928 Other abnormal and inconclusive findings on diagnostic imaging of breast: Secondary | ICD-10-CM

## 2016-12-08 ENCOUNTER — Ambulatory Visit
Admission: RE | Admit: 2016-12-08 | Discharge: 2016-12-08 | Disposition: A | Discharge status: Home / Self Care | Payer: Medicare Other | Source: Ambulatory Visit | Attending: Obstetrics and Gynecology | Admitting: Obstetrics and Gynecology

## 2016-12-08 DIAGNOSIS — R928 Other abnormal and inconclusive findings on diagnostic imaging of breast: Secondary | ICD-10-CM | POA: Diagnosis not present

## 2016-12-08 DIAGNOSIS — N6489 Other specified disorders of breast: Secondary | ICD-10-CM | POA: Diagnosis not present

## 2017-01-12 DIAGNOSIS — Z1339 Encounter for screening examination for other mental health and behavioral disorders: Secondary | ICD-10-CM | POA: Diagnosis not present

## 2017-01-12 DIAGNOSIS — Z6831 Body mass index (BMI) 31.0-31.9, adult: Secondary | ICD-10-CM | POA: Diagnosis not present

## 2017-01-12 DIAGNOSIS — R5383 Other fatigue: Secondary | ICD-10-CM | POA: Diagnosis not present

## 2017-01-12 DIAGNOSIS — Z1331 Encounter for screening for depression: Secondary | ICD-10-CM | POA: Diagnosis not present

## 2017-01-12 DIAGNOSIS — Z299 Encounter for prophylactic measures, unspecified: Secondary | ICD-10-CM | POA: Diagnosis not present

## 2017-01-12 DIAGNOSIS — M109 Gout, unspecified: Secondary | ICD-10-CM | POA: Diagnosis not present

## 2017-01-12 DIAGNOSIS — Z7189 Other specified counseling: Secondary | ICD-10-CM | POA: Diagnosis not present

## 2017-01-12 DIAGNOSIS — Z79899 Other long term (current) drug therapy: Secondary | ICD-10-CM | POA: Diagnosis not present

## 2017-01-12 DIAGNOSIS — E039 Hypothyroidism, unspecified: Secondary | ICD-10-CM | POA: Diagnosis not present

## 2017-01-12 DIAGNOSIS — F329 Major depressive disorder, single episode, unspecified: Secondary | ICD-10-CM | POA: Diagnosis not present

## 2017-01-12 DIAGNOSIS — Z0000 Encounter for general adult medical examination without abnormal findings: Secondary | ICD-10-CM | POA: Diagnosis not present

## 2017-01-12 DIAGNOSIS — I10 Essential (primary) hypertension: Secondary | ICD-10-CM | POA: Diagnosis not present

## 2017-01-12 DIAGNOSIS — Z1211 Encounter for screening for malignant neoplasm of colon: Secondary | ICD-10-CM | POA: Diagnosis not present

## 2017-01-26 DIAGNOSIS — E2839 Other primary ovarian failure: Secondary | ICD-10-CM | POA: Diagnosis not present

## 2017-03-11 DIAGNOSIS — Z6832 Body mass index (BMI) 32.0-32.9, adult: Secondary | ICD-10-CM | POA: Diagnosis not present

## 2017-03-11 DIAGNOSIS — Z789 Other specified health status: Secondary | ICD-10-CM | POA: Diagnosis not present

## 2017-03-11 DIAGNOSIS — I10 Essential (primary) hypertension: Secondary | ICD-10-CM | POA: Diagnosis not present

## 2017-03-11 DIAGNOSIS — N390 Urinary tract infection, site not specified: Secondary | ICD-10-CM | POA: Diagnosis not present

## 2017-03-11 DIAGNOSIS — Z299 Encounter for prophylactic measures, unspecified: Secondary | ICD-10-CM | POA: Diagnosis not present

## 2017-04-09 DIAGNOSIS — E039 Hypothyroidism, unspecified: Secondary | ICD-10-CM | POA: Diagnosis not present

## 2017-04-13 DIAGNOSIS — Z713 Dietary counseling and surveillance: Secondary | ICD-10-CM | POA: Diagnosis not present

## 2017-04-13 DIAGNOSIS — Z299 Encounter for prophylactic measures, unspecified: Secondary | ICD-10-CM | POA: Diagnosis not present

## 2017-04-13 DIAGNOSIS — Z6832 Body mass index (BMI) 32.0-32.9, adult: Secondary | ICD-10-CM | POA: Diagnosis not present

## 2017-04-13 DIAGNOSIS — I10 Essential (primary) hypertension: Secondary | ICD-10-CM | POA: Diagnosis not present

## 2017-04-13 DIAGNOSIS — E039 Hypothyroidism, unspecified: Secondary | ICD-10-CM | POA: Diagnosis not present

## 2017-07-16 DIAGNOSIS — E039 Hypothyroidism, unspecified: Secondary | ICD-10-CM | POA: Diagnosis not present

## 2017-07-16 DIAGNOSIS — I10 Essential (primary) hypertension: Secondary | ICD-10-CM | POA: Diagnosis not present

## 2017-07-16 DIAGNOSIS — Z299 Encounter for prophylactic measures, unspecified: Secondary | ICD-10-CM | POA: Diagnosis not present

## 2017-07-16 DIAGNOSIS — Z713 Dietary counseling and surveillance: Secondary | ICD-10-CM | POA: Diagnosis not present

## 2017-07-16 DIAGNOSIS — Z6832 Body mass index (BMI) 32.0-32.9, adult: Secondary | ICD-10-CM | POA: Diagnosis not present

## 2017-08-25 ENCOUNTER — Encounter (INDEPENDENT_AMBULATORY_CARE_PROVIDER_SITE_OTHER): Payer: Self-pay | Admitting: Orthopaedic Surgery

## 2017-08-25 ENCOUNTER — Ambulatory Visit (INDEPENDENT_AMBULATORY_CARE_PROVIDER_SITE_OTHER): Payer: Medicare Other | Admitting: Orthopaedic Surgery

## 2017-08-25 VITALS — BP 144/68 | HR 62 | Ht 65.0 in | Wt 180.0 lb

## 2017-08-25 DIAGNOSIS — G8929 Other chronic pain: Secondary | ICD-10-CM | POA: Diagnosis not present

## 2017-08-25 DIAGNOSIS — M25561 Pain in right knee: Secondary | ICD-10-CM | POA: Diagnosis not present

## 2017-08-25 MED ORDER — LIDOCAINE HCL 1 % IJ SOLN
2.0000 mL | INTRAMUSCULAR | Status: AC | PRN
  Administered 2017-08-25: 2 mL

## 2017-08-25 MED ORDER — METHYLPREDNISOLONE ACETATE 40 MG/ML IJ SUSP
80.0000 mg | INTRAMUSCULAR | Status: AC | PRN
  Administered 2017-08-25: 80 mg

## 2017-08-25 MED ORDER — BUPIVACAINE HCL 0.5 % IJ SOLN
2.0000 mL | INTRAMUSCULAR | Status: AC | PRN
  Administered 2017-08-25: 2 mL via INTRA_ARTICULAR

## 2017-08-25 NOTE — Progress Notes (Signed)
Office Visit Note   Patient: Joanne Tran           Date of Birth: 05-06-1940           MRN: [id number] Visit Date: 08/25/2017              Requested by: Wallene Huh, [address], [place] [address] PCP: Wallene Huh, MD   Assessment & Plan: Visit Diagnoses:  1. Chronic pain of right knee     Plan: Established diagnosis of osteoarthritis right knee.  Will reinject with cortisone.  Last injection in October  Follow-Up Instructions: Return if symptoms worsen or fail to improve.   Orders:  Orders Placed This Encounter  Procedures  . Large Joint Inj: R knee   No orders of the defined types were placed in this encounter.     Procedures: Large Joint Inj: R knee on 08/25/2017 12:14 PM Indications: pain and diagnostic evaluation Details: 25 G 1.5 in needle, anteromedial approach  Arthrogram: No  Medications: 2 mL lidocaine 1 %; 2 mL bupivacaine 0.5 %; 80 mg methylPREDNISolone acetate 40 MG/ML Procedure, treatment alternatives, risks and benefits explained, specific risks discussed. Consent was given by the patient. Immediately prior to procedure a time out was called to verify the correct patient, procedure, equipment, support staff and site/side marked as required. Patient was prepped and draped in the usual sterile fashion.       Clinical Data: No additional findings.   Subjective: Chief Complaint  Patient presents with  . Follow-up    R KNEE PAIN AND GIVES AWAY SOMETIMES. LAST CORTISONE INJECTION 10/14/16. DID GOOD BUT NOW PAIN IS COMING BACK  Joanne Tran has a history of osteoarthritis of both knees.  I have injected the right knee on several occasions the last of which was in October.  She is had a recent recurrence of her pain with some swelling and achiness and would like to have another cortisone injection.  No injury or trauma.  She is had a prior successful left total knee replacement 2 years ago  HPI  Review of Systems    Constitutional: Negative for fatigue and fever.  HENT: Negative for ear pain.   Eyes: Negative for pain.  Respiratory: Negative for cough and shortness of breath.   Cardiovascular: Negative for leg swelling.  Gastrointestinal: Negative for constipation and diarrhea.  Genitourinary: Negative for difficulty urinating.  Musculoskeletal: Negative for back pain and neck pain.  Skin: Negative for rash.  Allergic/Immunologic: Negative for food allergies.  Neurological: Positive for weakness. Negative for numbness.  Hematological: Bruises/bleeds easily.  Psychiatric/Behavioral: Negative for sleep disturbance.     Objective: Vital Signs: BP (!) 144/68 (BP Location: Left Arm, Patient Position: Sitting, Cuff Size: Normal)   Pulse 62   Ht 5\' 5"  (1.651 m)   Wt 180 lb (81.6 kg)   BMI 29.95 kg/m   Physical Exam  Constitutional: She is oriented to person, place, and time. She appears well-developed and well-nourished.  HENT:  Mouth/Throat: Oropharynx is clear and moist.  Eyes: Pupils are equal, round, and reactive to light. EOM are normal.  Pulmonary/Chest: Effort normal.  Neurological: She is alert and oriented to person, place, and time.  Skin: Skin is warm and dry.  Psychiatric: She has a normal mood and affect. Her behavior is normal.    Ortho Exam awake alert and oriented x3.  Comfortable sitting.  Very minimal effusion right knee.  Predominant medial joint pain.  Some patellar crepitation.  No  instability.  Full extension flexion over 100 degrees.  No distal edema.  Neurologically intact Specialty Comments:  No specialty comments available.  Imaging: No results found.   PMFS History: Patient Active Problem List   Diagnosis Date Noted  . Chronic pain of right knee 08/25/2017  . Symptomatic cholelithiasis 08/06/2015  . Primary osteoarthritis of left knee 06/11/2015  . S/P total knee replacement using cement 06/11/2015  . MITRAL VALVE PROLAPSE 11/16/2007  . HIATAL HERNIA  11/16/2007  . COUGH 11/16/2007  . THYROID CANCER, HX OF 11/16/2007   Past Medical History:  Diagnosis Date  . Anxiety    uses prozac  . Arthritis    "both knees" (08/06/2015)  . Cervical spondylosis   . Essential hypertension   . Gout   . Heart murmur    mitral valve prolapse with murmur  . Hiatal hernia   . History of mitral valve prolapse   . Hypertension   . Hypothyroidism   . Major depression   . Spinal headache 1970s   spinal headaches when they removed rectal polyps; "they had done a spinal tap"  . Thyroid cancer ([place]) 03/2007    Family History  Problem Relation Age of Onset  . Heart disease Father   . Heart attack Father   . Heart disease Mother   . Heart attack Mother   . Ovarian cancer Sister   . Heart disease Paternal Uncle        CABG    Past Surgical History:  Procedure Laterality Date  . ABDOMINAL HYSTERECTOMY  ~ 1975  . APPENDECTOMY  1950  . CARDIAC CATHETERIZATION  03/08/2003   Dr. Terrence Dupont  . CHOLECYSTECTOMY N/A 08/07/2015   Procedure: LAPAROSCOPIC CHOLECYSTECTOMY;  Surgeon: Judeth Horn, MD;  Location: [facility];  Service: General;  Laterality: N/A;  . DILATION AND CURETTAGE OF UTERUS  multiple  . JOINT REPLACEMENT    . TONSILLECTOMY  1950s  . TOTAL KNEE ARTHROPLASTY Left 06/11/2015   Procedure: LEFT TOTAL KNEE ARTHROPLASTY;  Surgeon: Garald Balding, MD;  Location: [facility];  Service: Orthopedics;  Laterality: Left;  . TOTAL THYROIDECTOMY  03/2007   Radioactive iodine therapy Dr. Chalmers Cater   Social History   Occupational History  . Not on file  Tobacco Use  . Smoking status: Never Smoker  . Smokeless tobacco: Never Used  Substance and Sexual Activity  . Alcohol use: No    Alcohol/week: 0.0 standard drinks  . Drug use: No  . Sexual activity: Never

## 2017-08-31 IMAGING — CT CT ABD-PELV W/ CM
2 of 5 series · 15 of 46 positions shown, 17 images · IV contrast (APPLIED)
Comparison: None.

CLINICAL DATA: Left lower quadrant abdominal pain with nausea and
diarrhea for 2 weeks.

EXAM:
CT ABDOMEN AND PELVIS WITH CONTRAST
TECHNIQUE: Multidetector CT imaging of the abdomen and pelvis was performed
using the standard protocol following bolus administration of
intravenous contrast.
CONTRAST:  80mL MEXSMN-1WW IOPAMIDOL (MEXSMN-1WW) INJECTION 61%

[Series 2: abd/ pelvis 5.0 i30f 1 · axial · 0.63mm/px · z∈[+958,+1393]mm · 12 of 99 slices shown, 14 images]
[im 6/99  soft-tissue]
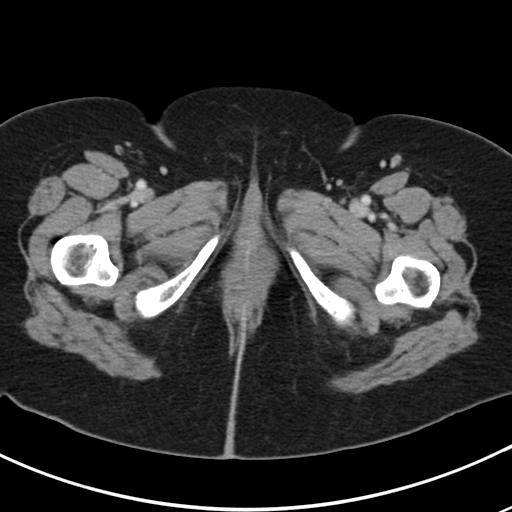
[im 6/99  bone]
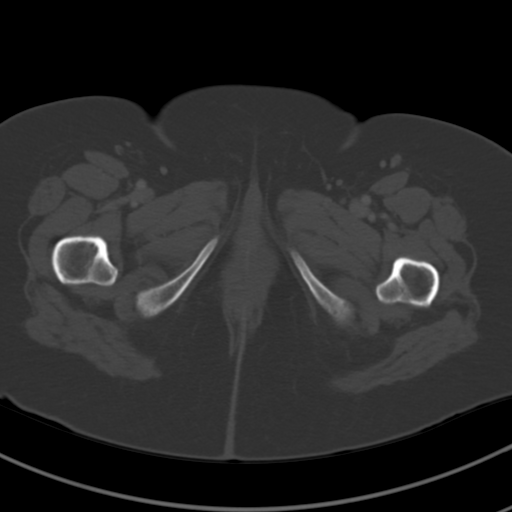
[im 17/99  soft-tissue]
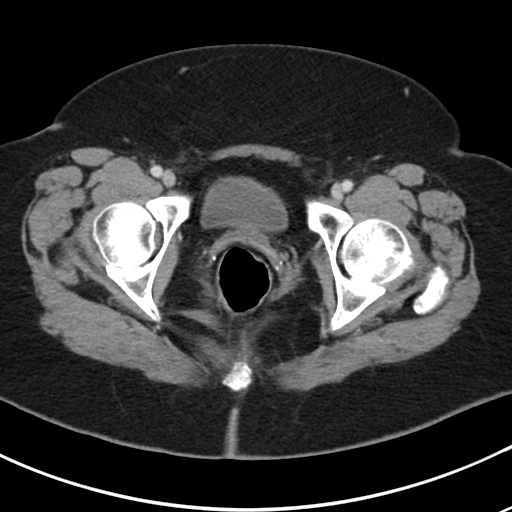
[im 22/99  soft-tissue]
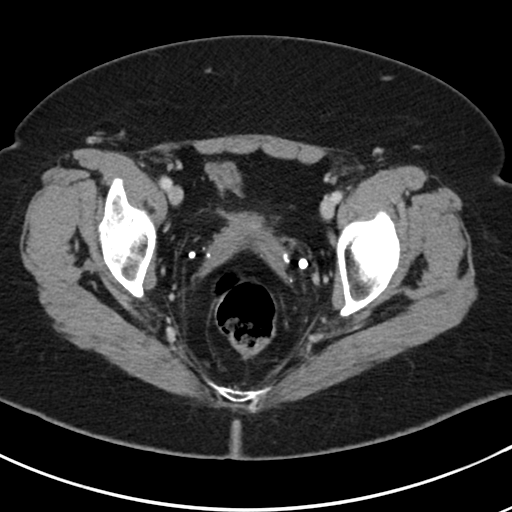
[im 28/99  soft-tissue]
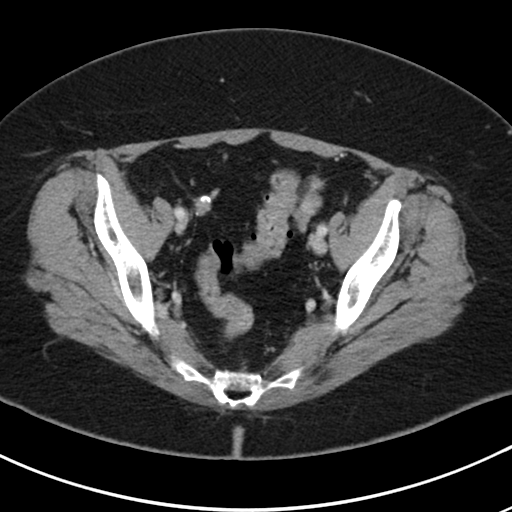
[im 39/99  soft-tissue]
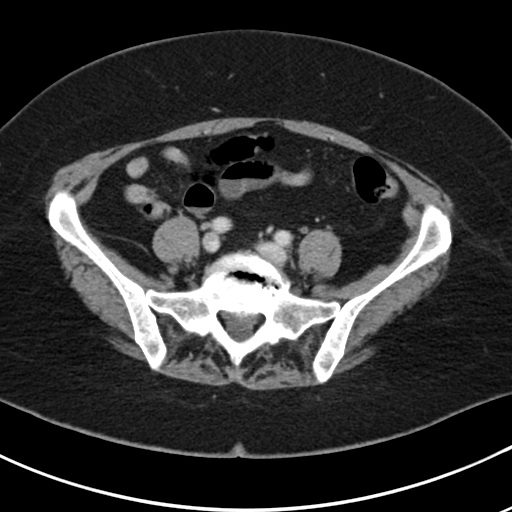
[im 44/99  soft-tissue]
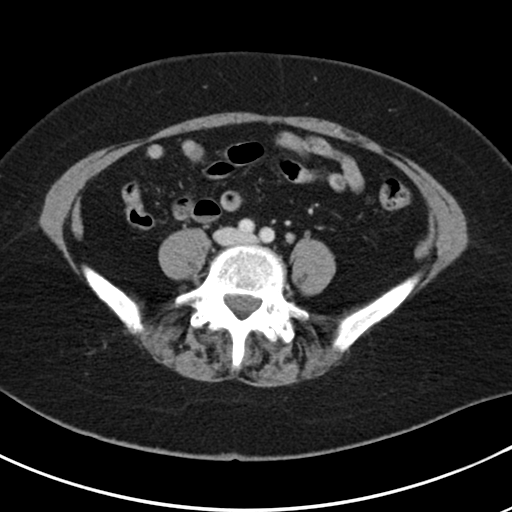
[im 55/99  soft-tissue]
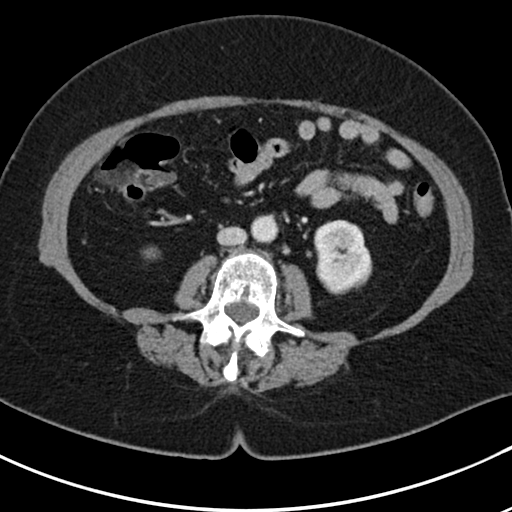
[im 60/99  soft-tissue]
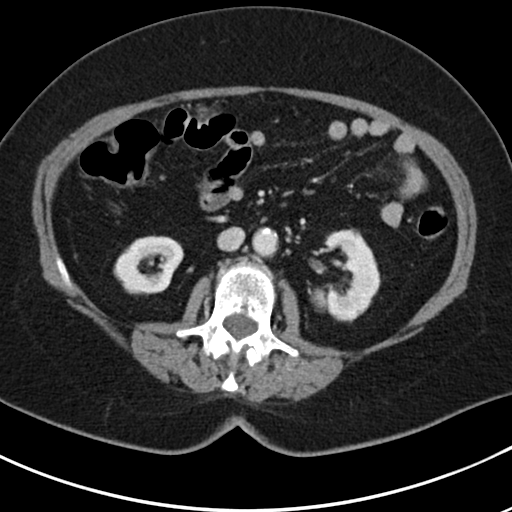
[im 71/99  soft-tissue]
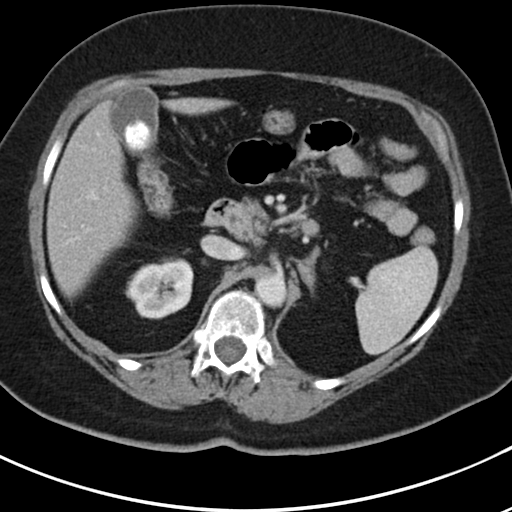
[im 71/99  bone]
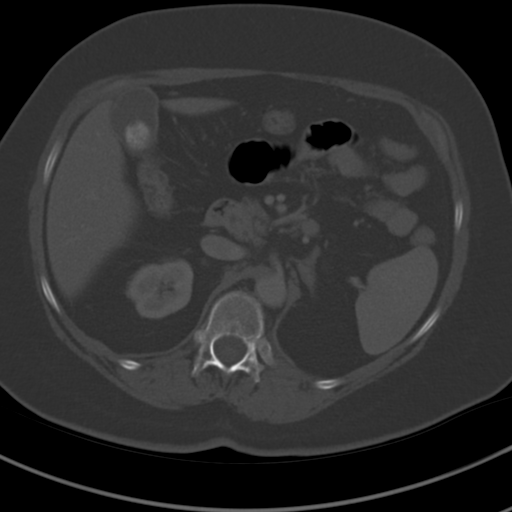
[im 77/99  soft-tissue]
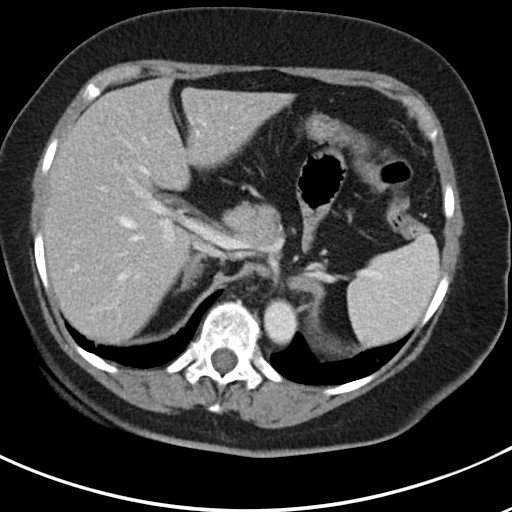
[im 82/99  soft-tissue]
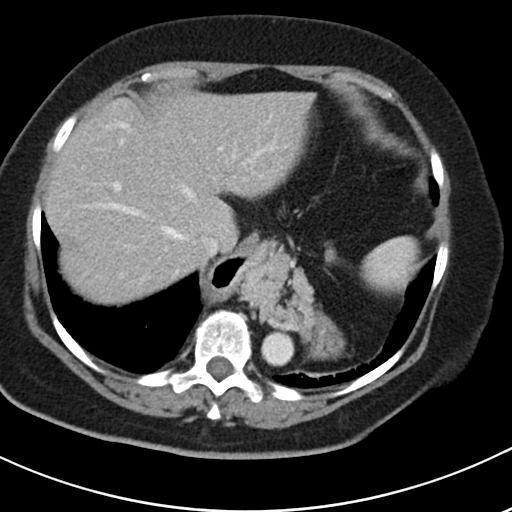
[im 93/99  soft-tissue]
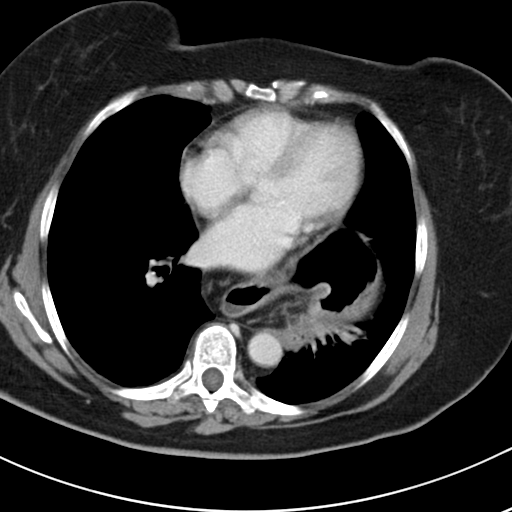

[Series 5: coronal soft tissue · coronal · 0.66mm/px · 3 of 81 slices shown]
[im 27/81  soft-tissue]
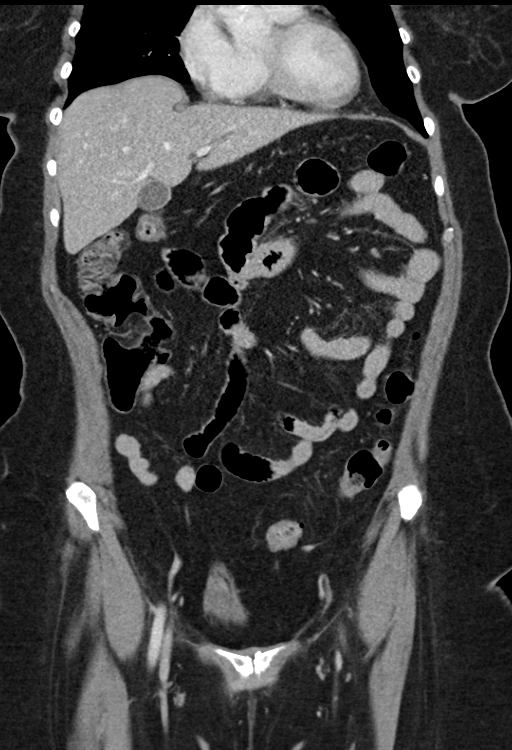
[im 36/81  soft-tissue]
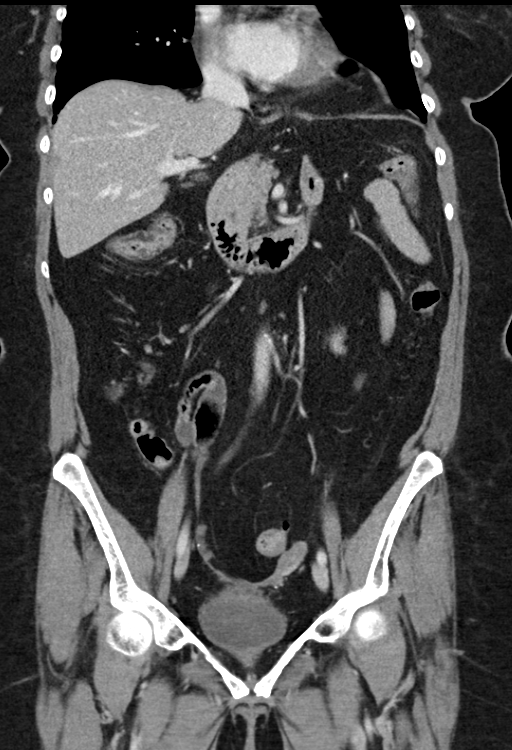
[im 45/81  soft-tissue]
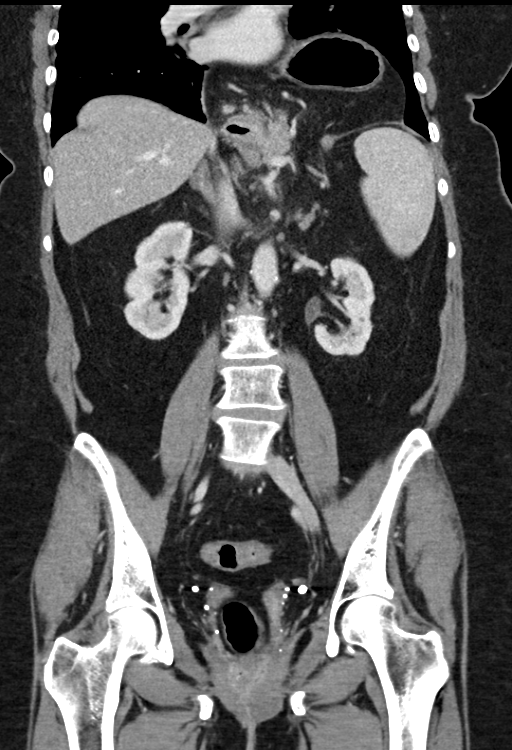

[15 of 46 positions shown; findings below may reference images not displayed]

FINDINGS: Lower chest: Large hiatal hernia which contains the stomach,
significant portion of the duodenum and pancreas. Probable chronic
atelectasis at the overlying left lung base.

Hepatobiliary: Large stone within the otherwise normal-appearing
gallbladder. Liver appears normal.

Pancreas: No mass, inflammatory changes, or other significant
abnormality. Herniated into the lower mediastinum at the hiatal
hernia, as above.

Spleen: Within normal limits in size and appearance.

Adrenals/Urinary Tract: Hypodense mass within the right adrenal
gland measures 1.8 cm greatest dimension, with CT density
measurements of 45-50 Hounsfield units. Left adrenal gland appears
normal.

Bilateral renal cysts. No renal stone or hydronephrosis bilaterally.
No ureteral or bladder calculi identified. Bladder is decompressed.

Stomach/Bowel: Bowel is normal in caliber. No bowel wall thickening
or evidence of bowel wall inflammation seen. Mild diverticulosis of
the sigmoid colon without evidence of acute diverticulitis. Status
post appendectomy.

Vascular/Lymphatic: Scattered atherosclerotic changes of the normal
caliber abdominal aorta. No enlarged lymph nodes seen within the
abdomen or pelvis.

Reproductive: Status post hysterectomy. No mass or inflammatory
change within either adnexal region.

Other: No free fluid or abscess collection. No free intraperitoneal
air.

Musculoskeletal: Degenerative changes of the thoracolumbar spine,
mild to moderate in degree. No acute or suspicious osseous finding.
Superficial soft tissues are unremarkable.
IMPRESSION: 1. No acute findings. No bowel obstruction or evidence of bowel wall
inflammation. No free fluid or abscess. No evidence of acute solid
organ abnormality. No renal or ureteral calculi.
2. Large hiatal hernia containing the stomach, duodenum and
pancreas.
3. Cholelithiasis without evidence of acute cholecystitis.
4. Right adrenal mass measuring 1.8 cm, not able to be confirmed as
a benign adrenal adenoma by CT density measurements. Consider
nonemergent adrenal protocol MRI to ensure benignity.
5. Colonic diverticulosis without evidence of acute diverticulitis.
6. Aortic atherosclerosis.

## 2017-10-11 DIAGNOSIS — E039 Hypothyroidism, unspecified: Secondary | ICD-10-CM | POA: Diagnosis not present

## 2017-10-11 DIAGNOSIS — J069 Acute upper respiratory infection, unspecified: Secondary | ICD-10-CM | POA: Diagnosis not present

## 2017-10-11 DIAGNOSIS — Z299 Encounter for prophylactic measures, unspecified: Secondary | ICD-10-CM | POA: Diagnosis not present

## 2017-10-11 DIAGNOSIS — Z6832 Body mass index (BMI) 32.0-32.9, adult: Secondary | ICD-10-CM | POA: Diagnosis not present

## 2017-10-11 DIAGNOSIS — I10 Essential (primary) hypertension: Secondary | ICD-10-CM | POA: Diagnosis not present

## 2017-10-11 DIAGNOSIS — Z789 Other specified health status: Secondary | ICD-10-CM | POA: Diagnosis not present

## 2017-10-16 ENCOUNTER — Encounter (HOSPITAL_COMMUNITY): Payer: Self-pay | Admitting: Emergency Medicine

## 2017-10-16 ENCOUNTER — Encounter (HOSPITAL_COMMUNITY)
Admission: EM | Disposition: A | Discharge status: Home / Self Care | Payer: Self-pay | Source: Home / Self Care | Attending: Emergency Medicine

## 2017-10-16 ENCOUNTER — Emergency Department (HOSPITAL_COMMUNITY): Payer: Medicare Other

## 2017-10-16 ENCOUNTER — Emergency Department (HOSPITAL_COMMUNITY): Payer: Medicare Other | Admitting: Anesthesiology

## 2017-10-16 ENCOUNTER — Emergency Department (HOSPITAL_COMMUNITY)
Admission: EM | Admit: 2017-10-16 | Discharge: 2017-10-16 | Disposition: A | Discharge status: Home / Self Care | Payer: Medicare Other | Attending: Emergency Medicine | Admitting: Emergency Medicine

## 2017-10-16 DIAGNOSIS — I10 Essential (primary) hypertension: Secondary | ICD-10-CM | POA: Diagnosis not present

## 2017-10-16 DIAGNOSIS — Z96652 Presence of left artificial knee joint: Secondary | ICD-10-CM | POA: Insufficient documentation

## 2017-10-16 DIAGNOSIS — Y92009 Unspecified place in unspecified non-institutional (private) residence as the place of occurrence of the external cause: Secondary | ICD-10-CM | POA: Diagnosis not present

## 2017-10-16 DIAGNOSIS — S82892C Other fracture of left lower leg, initial encounter for open fracture type IIIA, IIIB, or IIIC: Secondary | ICD-10-CM

## 2017-10-16 DIAGNOSIS — Z85850 Personal history of malignant neoplasm of thyroid: Secondary | ICD-10-CM | POA: Diagnosis not present

## 2017-10-16 DIAGNOSIS — Z880 Allergy status to penicillin: Secondary | ICD-10-CM | POA: Diagnosis not present

## 2017-10-16 DIAGNOSIS — Z885 Allergy status to narcotic agent status: Secondary | ICD-10-CM | POA: Insufficient documentation

## 2017-10-16 DIAGNOSIS — Z79890 Hormone replacement therapy (postmenopausal): Secondary | ICD-10-CM | POA: Insufficient documentation

## 2017-10-16 DIAGNOSIS — M25572 Pain in left ankle and joints of left foot: Secondary | ICD-10-CM | POA: Diagnosis not present

## 2017-10-16 DIAGNOSIS — F418 Other specified anxiety disorders: Secondary | ICD-10-CM | POA: Insufficient documentation

## 2017-10-16 DIAGNOSIS — W109XXA Fall (on) (from) unspecified stairs and steps, initial encounter: Secondary | ICD-10-CM | POA: Insufficient documentation

## 2017-10-16 DIAGNOSIS — W19XXXA Unspecified fall, initial encounter: Secondary | ICD-10-CM

## 2017-10-16 DIAGNOSIS — S82852A Displaced trimalleolar fracture of left lower leg, initial encounter for closed fracture: Secondary | ICD-10-CM | POA: Insufficient documentation

## 2017-10-16 DIAGNOSIS — Z79899 Other long term (current) drug therapy: Secondary | ICD-10-CM | POA: Diagnosis not present

## 2017-10-16 DIAGNOSIS — Z049 Encounter for examination and observation for unspecified reason: Secondary | ICD-10-CM | POA: Diagnosis not present

## 2017-10-16 DIAGNOSIS — F329 Major depressive disorder, single episode, unspecified: Secondary | ICD-10-CM | POA: Diagnosis not present

## 2017-10-16 DIAGNOSIS — I341 Nonrheumatic mitral (valve) prolapse: Secondary | ICD-10-CM | POA: Insufficient documentation

## 2017-10-16 DIAGNOSIS — Z419 Encounter for procedure for purposes other than remedying health state, unspecified: Secondary | ICD-10-CM

## 2017-10-16 DIAGNOSIS — W108XXA Fall (on) (from) other stairs and steps, initial encounter: Secondary | ICD-10-CM | POA: Diagnosis not present

## 2017-10-16 DIAGNOSIS — E890 Postprocedural hypothyroidism: Secondary | ICD-10-CM | POA: Insufficient documentation

## 2017-10-16 DIAGNOSIS — Z882 Allergy status to sulfonamides status: Secondary | ICD-10-CM | POA: Insufficient documentation

## 2017-10-16 DIAGNOSIS — R601 Generalized edema: Secondary | ICD-10-CM | POA: Diagnosis not present

## 2017-10-16 DIAGNOSIS — G8918 Other acute postprocedural pain: Secondary | ICD-10-CM | POA: Diagnosis not present

## 2017-10-16 DIAGNOSIS — M1990 Unspecified osteoarthritis, unspecified site: Secondary | ICD-10-CM | POA: Diagnosis not present

## 2017-10-16 DIAGNOSIS — F419 Anxiety disorder, unspecified: Secondary | ICD-10-CM | POA: Insufficient documentation

## 2017-10-16 DIAGNOSIS — S92142B Displaced dome fracture of left talus, initial encounter for open fracture: Secondary | ICD-10-CM | POA: Diagnosis not present

## 2017-10-16 DIAGNOSIS — S9305XA Dislocation of left ankle joint, initial encounter: Secondary | ICD-10-CM | POA: Diagnosis not present

## 2017-10-16 HISTORY — PX: ORIF ANKLE FRACTURE: SHX5408

## 2017-10-16 LAB — CBC WITH DIFFERENTIAL/PLATELET
ABS IMMATURE GRANULOCYTES: 0.06 10*3/uL (ref 0.00–0.07)
BASOS ABS: 0 10*3/uL (ref 0.0–0.1)
BASOS PCT: 0 %
Eosinophils Absolute: 0 10*3/uL (ref 0.0–0.5)
Eosinophils Relative: 0 %
HCT: 39.9 % (ref 36.0–46.0)
Hemoglobin: 12 g/dL (ref 12.0–15.0)
IMMATURE GRANULOCYTES: 1 %
LYMPHS ABS: 1.8 10*3/uL (ref 0.7–4.0)
Lymphocytes Relative: 17 %
MCH: 26 pg (ref 26.0–34.0)
MCHC: 30.1 g/dL (ref 30.0–36.0)
MCV: 86.4 fL (ref 80.0–100.0)
MONOS PCT: 5 %
Monocytes Absolute: 0.5 10*3/uL (ref 0.1–1.0)
NEUTROS ABS: 8.6 10*3/uL — AB (ref 1.7–7.7)
NEUTROS PCT: 77 %
NRBC: 0 % (ref 0.0–0.2)
Platelets: 238 10*3/uL (ref 150–400)
RBC: 4.62 MIL/uL (ref 3.87–5.11)
RDW: 15.5 % (ref 11.5–15.5)
WBC: 11 10*3/uL — ABNORMAL HIGH (ref 4.0–10.5)

## 2017-10-16 LAB — BASIC METABOLIC PANEL
Anion gap: 10 (ref 5–15)
BUN: 14 mg/dL (ref 8–23)
CO2: 23 mmol/L (ref 22–32)
Calcium: 8.9 mg/dL (ref 8.9–10.3)
Chloride: 106 mmol/L (ref 98–111)
Creatinine, Ser: 1.17 mg/dL — ABNORMAL HIGH (ref 0.44–1.00)
GFR calc Af Amer: 52 mL/min — ABNORMAL LOW (ref >60)
GFR, EST NON AFRICAN AMERICAN: 45 mL/min — AB (ref >60)
Glucose, Bld: 148 mg/dL — ABNORMAL HIGH (ref 70–99)
POTASSIUM: 3.3 mmol/L — AB (ref 3.5–5.1)
Sodium: 139 mmol/L (ref 135–145)

## 2017-10-16 SURGERY — OPEN REDUCTION INTERNAL FIXATION (ORIF) ANKLE FRACTURE
Anesthesia: General | Site: Ankle | Laterality: Left

## 2017-10-16 MED ORDER — PROPOFOL 10 MG/ML IV BOLUS
60.0000 mg | Freq: Once | INTRAVENOUS | Status: AC
  Administered 2017-10-16: 60 mg via INTRAVENOUS
  Filled 2017-10-16: qty 20

## 2017-10-16 MED ORDER — ONDANSETRON HCL 4 MG/2ML IJ SOLN
INTRAMUSCULAR | Status: DC | PRN
  Administered 2017-10-16: 4 mg via INTRAVENOUS

## 2017-10-16 MED ORDER — MELOXICAM 7.5 MG PO TABS: 15.0000 mg | ORAL_TABLET | Freq: Every day | ORAL | Status: DC

## 2017-10-16 MED ORDER — ONDANSETRON HCL 4 MG/2ML IJ SOLN: 4.0000 mg | Freq: Four times a day (QID) | INTRAMUSCULAR | Status: DC | PRN

## 2017-10-16 MED ORDER — HYDRALAZINE HCL 20 MG/ML IJ SOLN
5.0000 mg | Freq: Once | INTRAMUSCULAR | Status: AC
  Administered 2017-10-16: 5 mg via INTRAVENOUS

## 2017-10-16 MED ORDER — HYDRALAZINE HCL 20 MG/ML IJ SOLN
INTRAMUSCULAR | Status: AC
  Administered 2017-10-16: 5 mg via INTRAVENOUS
  Filled 2017-10-16: qty 1

## 2017-10-16 MED ORDER — FENTANYL CITRATE (PF) 250 MCG/5ML IJ SOLN
INTRAMUSCULAR | Status: AC
  Filled 2017-10-16: qty 5

## 2017-10-16 MED ORDER — ONDANSETRON HCL 4 MG/2ML IJ SOLN
INTRAMUSCULAR | Status: AC
  Filled 2017-10-16: qty 2

## 2017-10-16 MED ORDER — FENTANYL CITRATE (PF) 100 MCG/2ML IJ SOLN
INTRAMUSCULAR | Status: AC
  Administered 2017-10-16: 25 ug via INTRAVENOUS
  Filled 2017-10-16: qty 2

## 2017-10-16 MED ORDER — SODIUM CHLORIDE 0.9 % IV SOLN
INTRAVENOUS | Status: DC | PRN
  Administered 2017-10-16: 25 ug/min via INTRAVENOUS

## 2017-10-16 MED ORDER — ACETAMINOPHEN 500 MG PO TABS
1000.0000 mg | ORAL_TABLET | Freq: Once | ORAL | Status: AC
  Administered 2017-10-16: 1000 mg via ORAL

## 2017-10-16 MED ORDER — TETANUS-DIPHTH-ACELL PERTUSSIS 5-2.5-18.5 LF-MCG/0.5 IM SUSP
0.5000 mL | Freq: Once | INTRAMUSCULAR | Status: AC
  Administered 2017-10-16: 0.5 mL via INTRAMUSCULAR
  Filled 2017-10-16: qty 0.5

## 2017-10-16 MED ORDER — MELOXICAM 15 MG PO TABS: 15.0000 mg | ORAL_TABLET | Freq: Every day | ORAL | 3 refills | Status: AC

## 2017-10-16 MED ORDER — 0.9 % SODIUM CHLORIDE (POUR BTL) OPTIME
TOPICAL | Status: DC | PRN
  Administered 2017-10-16: 1000 mL

## 2017-10-16 MED ORDER — ASPIRIN 325 MG PO TABS: 325.0000 mg | ORAL_TABLET | Freq: Every day | ORAL | 0 refills | Status: AC

## 2017-10-16 MED ORDER — DEXAMETHASONE SODIUM PHOSPHATE 10 MG/ML IJ SOLN
INTRAMUSCULAR | Status: AC
  Filled 2017-10-16: qty 1

## 2017-10-16 MED ORDER — CEFAZOLIN SODIUM-DEXTROSE 2-4 GM/100ML-% IV SOLN
INTRAVENOUS | Status: AC
  Filled 2017-10-16: qty 100

## 2017-10-16 MED ORDER — FENTANYL CITRATE (PF) 100 MCG/2ML IJ SOLN
25.0000 ug | INTRAMUSCULAR | Status: DC | PRN
  Administered 2017-10-16: 25 ug via INTRAVENOUS

## 2017-10-16 MED ORDER — ASPIRIN 325 MG PO TABS: 325.0000 mg | ORAL_TABLET | Freq: Every day | ORAL | Status: DC

## 2017-10-16 MED ORDER — OXYCODONE HCL 5 MG PO TABS: 5.0000 mg | ORAL_TABLET | ORAL | 0 refills | Status: AC | PRN

## 2017-10-16 MED ORDER — ONDANSETRON HCL 4 MG PO TABS: 4.0000 mg | ORAL_TABLET | Freq: Three times a day (TID) | ORAL | 2 refills | Status: DC | PRN

## 2017-10-16 MED ORDER — ARTIFICIAL TEARS OPHTHALMIC OINT
TOPICAL_OINTMENT | OPHTHALMIC | Status: AC
  Filled 2017-10-16: qty 3.5

## 2017-10-16 MED ORDER — FENTANYL CITRATE (PF) 100 MCG/2ML IJ SOLN
INTRAMUSCULAR | Status: AC
  Administered 2017-10-16: 50 ug via INTRAVENOUS
  Filled 2017-10-16: qty 2

## 2017-10-16 MED ORDER — PROPOFOL 10 MG/ML IV BOLUS
INTRAVENOUS | Status: DC | PRN
  Administered 2017-10-16: 120 mg via INTRAVENOUS

## 2017-10-16 MED ORDER — BUPIVACAINE-EPINEPHRINE (PF) 0.5% -1:200000 IJ SOLN
INTRAMUSCULAR | Status: DC | PRN
  Administered 2017-10-16: 30 mL via PERINEURAL

## 2017-10-16 MED ORDER — CEFAZOLIN SODIUM-DEXTROSE 1-4 GM/50ML-% IV SOLN
1.0000 g | Freq: Once | INTRAVENOUS | Status: AC
  Administered 2017-10-16: 1 g via INTRAVENOUS
  Filled 2017-10-16: qty 50

## 2017-10-16 MED ORDER — MIDAZOLAM HCL 2 MG/2ML IJ SOLN
1.0000 mg | Freq: Once | INTRAMUSCULAR | Status: AC
  Administered 2017-10-16: 1 mg via INTRAVENOUS

## 2017-10-16 MED ORDER — DEXAMETHASONE SODIUM PHOSPHATE 10 MG/ML IJ SOLN
INTRAMUSCULAR | Status: DC | PRN
  Administered 2017-10-16: 10 mg via INTRAVENOUS

## 2017-10-16 MED ORDER — METOCLOPRAMIDE HCL 5 MG/ML IJ SOLN: 5.0000 mg | Freq: Three times a day (TID) | INTRAMUSCULAR | Status: DC | PRN

## 2017-10-16 MED ORDER — OXYCODONE HCL 5 MG PO TABS: 5.0000 mg | ORAL_TABLET | ORAL | Status: DC | PRN

## 2017-10-16 MED ORDER — ACETAMINOPHEN 500 MG PO TABS: 500.0000 mg | ORAL_TABLET | Freq: Once | ORAL | Status: DC

## 2017-10-16 MED ORDER — FENTANYL CITRATE (PF) 100 MCG/2ML IJ SOLN
50.0000 ug | Freq: Once | INTRAMUSCULAR | Status: AC
  Administered 2017-10-16: 50 ug via INTRAVENOUS

## 2017-10-16 MED ORDER — ACETAMINOPHEN 500 MG PO TABS
ORAL_TABLET | ORAL | Status: AC
  Administered 2017-10-16: 1000 mg via ORAL
  Filled 2017-10-16: qty 2

## 2017-10-16 MED ORDER — CEFAZOLIN SODIUM-DEXTROSE 2-3 GM-%(50ML) IV SOLR
INTRAVENOUS | Status: DC | PRN
  Administered 2017-10-16: 2 g via INTRAVENOUS

## 2017-10-16 MED ORDER — ONDANSETRON HCL 4 MG PO TABS: 4.0000 mg | ORAL_TABLET | Freq: Three times a day (TID) | ORAL | Status: DC | PRN

## 2017-10-16 MED ORDER — METOCLOPRAMIDE HCL 5 MG PO TABS: 5.0000 mg | ORAL_TABLET | Freq: Three times a day (TID) | ORAL | Status: DC | PRN

## 2017-10-16 MED ORDER — ONDANSETRON HCL 4 MG PO TABS: 4.0000 mg | ORAL_TABLET | Freq: Four times a day (QID) | ORAL | Status: DC | PRN

## 2017-10-16 MED ORDER — MIDAZOLAM HCL 2 MG/2ML IJ SOLN
INTRAMUSCULAR | Status: AC
  Administered 2017-10-16: 1 mg via INTRAVENOUS
  Filled 2017-10-16: qty 2

## 2017-10-16 MED ORDER — LACTATED RINGERS IV SOLN
INTRAVENOUS | Status: DC
  Administered 2017-10-16: 13:00:00 via INTRAVENOUS

## 2017-10-16 MED ORDER — FENTANYL CITRATE (PF) 100 MCG/2ML IJ SOLN
50.0000 ug | INTRAMUSCULAR | Status: DC | PRN
  Administered 2017-10-16: 50 ug via INTRAVENOUS
  Filled 2017-10-16 (×2): qty 2

## 2017-10-16 MED ORDER — PROPOFOL 10 MG/ML IV BOLUS
INTRAVENOUS | Status: AC
  Filled 2017-10-16: qty 20

## 2017-10-16 MED ORDER — BUPIVACAINE HCL (PF) 0.5 % IJ SOLN
INTRAMUSCULAR | Status: DC | PRN
  Administered 2017-10-16: 10 mL

## 2017-10-16 SURGICAL SUPPLY — 66 items
ALCOHOL 70% 16 OZ (MISCELLANEOUS) IMPLANT
BANDAGE ACE 6X5 VEL STRL LF (GAUZE/BANDAGES/DRESSINGS) ×3 IMPLANT
BANDAGE ESMARK 6X9 LF (GAUZE/BANDAGES/DRESSINGS) ×1 IMPLANT
BIT DRILL 2.5X2.75 QC CALB (BIT) ×3 IMPLANT
BIT DRILL 2.9 CANN QC NONSTRL (BIT) ×3 IMPLANT
BIT DRILL 3.5X5.5 QC CALB (BIT) ×3 IMPLANT
BLADE SURG 15 STRL LF DISP TIS (BLADE) ×1 IMPLANT
BLADE SURG 15 STRL SS (BLADE) ×2
BNDG COHESIVE 4X5 TAN STRL (GAUZE/BANDAGES/DRESSINGS) IMPLANT
BNDG COHESIVE 6X5 TAN STRL LF (GAUZE/BANDAGES/DRESSINGS) IMPLANT
BNDG ESMARK 6X9 LF (GAUZE/BANDAGES/DRESSINGS) ×3
CANISTER SUCT 3000ML PPV (MISCELLANEOUS) ×3 IMPLANT
CHLORAPREP W/TINT 26ML (MISCELLANEOUS) ×3 IMPLANT
COVER SURGICAL LIGHT HANDLE (MISCELLANEOUS) ×3 IMPLANT
COVER WAND RF STERILE (DRAPES) ×3 IMPLANT
CUFF TOURNIQUET SINGLE 34IN LL (TOURNIQUET CUFF) ×3 IMPLANT
CUFF TOURNIQUET SINGLE 44IN (TOURNIQUET CUFF) IMPLANT
DRAPE OEC MINIVIEW 54X84 (DRAPES) IMPLANT
DRAPE U-SHAPE 47X51 STRL (DRAPES) ×3 IMPLANT
DRSG MEPITEL 4X7.2 (GAUZE/BANDAGES/DRESSINGS) IMPLANT
DRSG PAD ABDOMINAL 8X10 ST (GAUZE/BANDAGES/DRESSINGS) IMPLANT
ELECT REM PT RETURN 9FT ADLT (ELECTROSURGICAL) ×3
ELECTRODE REM PT RTRN 9FT ADLT (ELECTROSURGICAL) ×1 IMPLANT
GAUZE SPONGE 4X4 12PLY STRL (GAUZE/BANDAGES/DRESSINGS) ×3 IMPLANT
GAUZE XEROFORM 1X8 LF (GAUZE/BANDAGES/DRESSINGS) ×3 IMPLANT
GLOVE BIO SURGEON STRL SZ7.5 (GLOVE) ×3 IMPLANT
GLOVE BIOGEL PI IND STRL 8 (GLOVE) ×1 IMPLANT
GLOVE BIOGEL PI INDICATOR 8 (GLOVE) ×2
GLOVE ECLIPSE 8.0 STRL XLNG CF (GLOVE) ×3 IMPLANT
GOWN STRL REUS W/ TWL LRG LVL3 (GOWN DISPOSABLE) ×1 IMPLANT
GOWN STRL REUS W/ TWL XL LVL3 (GOWN DISPOSABLE) ×2 IMPLANT
GOWN STRL REUS W/TWL LRG LVL3 (GOWN DISPOSABLE) ×2
GOWN STRL REUS W/TWL XL LVL3 (GOWN DISPOSABLE) ×4
KIT BASIN OR (CUSTOM PROCEDURE TRAY) ×3 IMPLANT
KIT TURNOVER KIT B (KITS) ×3 IMPLANT
NS IRRIG 1000ML POUR BTL (IV SOLUTION) ×3 IMPLANT
PACK ORTHO EXTREMITY (CUSTOM PROCEDURE TRAY) ×3 IMPLANT
PAD ABD 8X10 STRL (GAUZE/BANDAGES/DRESSINGS) ×3 IMPLANT
PAD ARMBOARD 7.5X6 YLW CONV (MISCELLANEOUS) ×6 IMPLANT
PAD CAST 4YDX4 CTTN HI CHSV (CAST SUPPLIES) ×1 IMPLANT
PADDING CAST COTTON 4X4 STRL (CAST SUPPLIES) ×2
PADDING CAST SYNTHETIC 4 (CAST SUPPLIES) ×2
PADDING CAST SYNTHETIC 4X4 STR (CAST SUPPLIES) ×1 IMPLANT
PLATE ACE 100DEG 6HOLE (Plate) ×3 IMPLANT
SCREW ACE CAN 4.0 46M (Screw) ×6 IMPLANT
SCREW CORTICAL 3.5MM  12MM (Screw) ×6 IMPLANT
SCREW CORTICAL 3.5MM  16MM (Screw) ×2 IMPLANT
SCREW CORTICAL 3.5MM 12MM (Screw) ×3 IMPLANT
SCREW CORTICAL 3.5MM 14MM (Screw) ×3 IMPLANT
SCREW CORTICAL 3.5MM 16MM (Screw) ×1 IMPLANT
SCREW CORTICAL 3.5MM 50MM (Screw) ×3 IMPLANT
SCREW LAG CANC STD HEAD 4.0 65 (Screw) ×3 IMPLANT
SPLINT FIBERGLASS 4X30 (CAST SUPPLIES) ×3 IMPLANT
SPONGE LAP 18X18 X RAY DECT (DISPOSABLE) ×3 IMPLANT
STAPLER VISISTAT 35W (STAPLE) ×3 IMPLANT
SUCTION FRAZIER HANDLE 10FR (MISCELLANEOUS) ×2
SUCTION TUBE FRAZIER 10FR DISP (MISCELLANEOUS) ×1 IMPLANT
SUT ETHILON 3 0 PS 1 (SUTURE) ×6 IMPLANT
SUT MNCRL AB 3-0 PS2 18 (SUTURE) ×6 IMPLANT
SUT PDS AB 2-0 CT1 27 (SUTURE) ×3 IMPLANT
SUT VIC AB 2-0 CT1 27 (SUTURE) ×4
SUT VIC AB 2-0 CT1 TAPERPNT 27 (SUTURE) ×2 IMPLANT
TOWEL OR 17X24 6PK STRL BLUE (TOWEL DISPOSABLE) ×3 IMPLANT
TOWEL OR 17X26 10 PK STRL BLUE (TOWEL DISPOSABLE) ×3 IMPLANT
TUBE CONNECTING 12'X1/4 (SUCTIONS) ×1
TUBE CONNECTING 12X1/4 (SUCTIONS) ×2 IMPLANT

## 2017-10-16 NOTE — H&P (Signed)
Joanne Tran is an 77 y.o. female.   Chief Complaint: Left ankle fracture dislocation HPI: Joanne Tran is a 77 year old female who fell on the stairs last night while coming to get a drink of water.  She was not able to move and therefore was down for several hours.  She was brought to the emergency department after she is able to get her the phone.  In the emergency department x-rays revealed a ankle fracture dislocation with a small medial open wound.  Attempt at reduction was made in the emergency department which was unsuccessful.  She was indicated for open treatment of left ankle fracture dislocation with irrigation debridement of her open fracture.  She was seen in the preoperative holding area.  Pain is controlled currently in the left ankle.  She denies pain in any other joint or extremity.  She has had a left total knee arthroplasty done by [facility].  She also has occasional right knee pain but this is not exacerbated today.  Past Medical History:  Diagnosis Date  . Anxiety    uses prozac  . Arthritis    "both knees" (08/06/2015)  . Cervical spondylosis   . Essential hypertension   . Gout   . Heart murmur    mitral valve prolapse with murmur  . Hiatal hernia   . History of mitral valve prolapse   . Hypertension   . Hypothyroidism   . Major depression   . Spinal headache 1970s   spinal headaches when they removed rectal polyps; "they had done a spinal tap"  . Thyroid cancer ([place]) 03/2007    Past Surgical History:  Procedure Laterality Date  . ABDOMINAL HYSTERECTOMY  ~ 1975  . APPENDECTOMY  1950  . CARDIAC CATHETERIZATION  03/08/2003   Dr. Terrence Dupont  . CHOLECYSTECTOMY N/A 08/07/2015   Procedure: LAPAROSCOPIC CHOLECYSTECTOMY;  Surgeon: Judeth Horn, MD;  Location: [facility];  Service: General;  Laterality: N/A;  . DILATION AND CURETTAGE OF UTERUS  multiple  . JOINT REPLACEMENT    . TONSILLECTOMY  1950s  . TOTAL KNEE ARTHROPLASTY Left 06/11/2015   Procedure: LEFT TOTAL KNEE  ARTHROPLASTY;  Surgeon: Garald Balding, MD;  Location: [facility];  Service: Orthopedics;  Laterality: Left;  . TOTAL THYROIDECTOMY  03/2007   Radioactive iodine therapy Dr. Chalmers Cater    Family History  Problem Relation Age of Onset  . Heart disease Father   . Heart attack Father   . Heart disease Mother   . Heart attack Mother   . Ovarian cancer Sister   . Heart disease Paternal Uncle        CABG   Social History:  reports that she has never smoked. She has never used smokeless tobacco. She reports that she does not drink alcohol or use drugs.  Allergies:  Allergies  Allergen Reactions  . Codeine Itching  . Penicillins Rash    When she was younger  . Sulfa Antibiotics Rash    Medications Prior to Admission  Medication Sig Dispense Refill  . allopurinol (ZYLOPRIM) 100 MG tablet     . Ascorbic Acid (VITAMIN C) 1000 MG tablet Take 1,000 mg by mouth daily.    Marland Kitchen atenolol (TENORMIN) 50 MG tablet Take 50 mg by mouth daily.    Marland Kitchen azithromycin (ZITHROMAX) 250 MG tablet Take 250 mg by mouth daily. Started on 10-12-17 DS 5  0  . FLUoxetine (PROZAC) 20 MG capsule Take 20 mg by mouth daily.    Marland Kitchen levothyroxine (SYNTHROID, LEVOTHROID) 100  MCG tablet     . lisinopril (PRINIVIL,ZESTRIL) 40 MG tablet Take 40 mg by mouth daily.    Marland Kitchen Lysine 500 MG TABS Take 1 tablet by mouth daily.    . naproxen sodium (ALEVE) 220 MG tablet Take 440 mg by mouth as needed (pain).    . pantoprazole (PROTONIX) 40 MG tablet     . predniSONE (DELTASONE) 10 MG tablet Take 10 mg by mouth daily. Duration 7 days  0  . simethicone (MYLICON) 80 MG chewable tablet Chew 0.5 tablets (40 mg total) by mouth every 6 (six) hours as needed for flatulence (bloating). 30 tablet 0  . acetaminophen (TYLENOL) 325 MG tablet Take 2 tablets (650 mg total) by mouth every 6 (six) hours as needed. (Patient not taking: Reported on 10/16/2017) 30 tablet 0  . colchicine 0.6 MG tablet Take 1 tablet (0.6 mg total) by mouth 2 (two) times daily as needed  (FOR GOUT ATTACK). (Patient not taking: Reported on 10/16/2017) 30 tablet 0  . ondansetron (ZOFRAN) 4 MG tablet Take 1 tablet (4 mg total) by mouth every 8 (eight) hours as needed for nausea. (Patient not taking: Reported on 10/16/2017) 8 tablet 5  . polyethylene glycol (MIRALAX / GLYCOLAX) packet Take 17 g by mouth daily as needed for mild constipation. (Patient not taking: Reported on 10/16/2017) 14 each 0  . rivaroxaban (XARELTO) 10 MG TABS tablet Take 1 tablet (10 mg total) by mouth daily with breakfast. (Patient not taking: Reported on 11/13/2015) 10 tablet 0    Results for orders placed or performed during the hospital encounter of 10/16/17 (from the past 48 hour(s))  Basic metabolic panel     Status: Abnormal   Collection Time: 10/16/17  8:08 AM  Result Value Ref Range   Sodium 139 135 - 145 mmol/L   Potassium 3.3 (L) 3.5 - 5.1 mmol/L   Chloride 106 98 - 111 mmol/L   CO2 23 22 - 32 mmol/L   Glucose, Bld 148 (H) 70 - 99 mg/dL   BUN 14 8 - 23 mg/dL   Creatinine, Ser 1.17 (H) 0.44 - 1.00 mg/dL   Calcium 8.9 8.9 - 10.3 mg/dL   GFR calc non Af Amer 45 (L) >60 mL/min   GFR calc Af Amer 52 (L) >60 mL/min    Comment: (NOTE) The eGFR has been calculated using the CKD EPI equation. This calculation has not been validated in all clinical situations. eGFR's persistently <60 mL/min signify possible Chronic Kidney Disease.    Anion gap 10 5 - 15    Comment: Performed at [facility] [address], [place], [place] [address]  CBC with Differential     Status: Abnormal   Collection Time: 10/16/17  8:08 AM  Result Value Ref Range   WBC 11.0 (H) 4.0 - 10.5 K/uL   RBC 4.62 3.87 - 5.11 MIL/uL   Hemoglobin 12.0 12.0 - 15.0 g/dL   HCT 39.9 36.0 - 46.0 %   MCV 86.4 80.0 - 100.0 fL   MCH 26.0 26.0 - 34.0 pg   MCHC 30.1 30.0 - 36.0 g/dL   RDW 15.5 11.5 - 15.5 %   Platelets 238 150 - 400 K/uL   nRBC 0.0 0.0 - 0.2 %   Neutrophils Relative % 77 %   Neutro Abs 8.6 (H) 1.7 - 7.7 K/uL    Lymphocytes Relative 17 %   Lymphs Abs 1.8 0.7 - 4.0 K/uL   Monocytes Relative 5 %   Monocytes Absolute 0.5  0.1 - 1.0 K/uL   Eosinophils Relative 0 %   Eosinophils Absolute 0.0 0.0 - 0.5 K/uL   Basophils Relative 0 %   Basophils Absolute 0.0 0.0 - 0.1 K/uL   Immature Granulocytes 1 %   Abs Immature Granulocytes 0.06 0.00 - 0.07 K/uL    Comment: Performed at [facility] [address], [place], [place] [address]   Dg Ankle 2 Views Left  Result Date: 10/16/2017 CLINICAL DATA:  Status post reduction. EXAM: LEFT ANKLE - 2 VIEW COMPARISON:  Two-view ankle radiographs 11/16/2017. FINDINGS: Two views are submitted within a plaster cast. There is partial reduction of the fracture dislocation. Lateral displacement of the talus is still 1.5 cm. Residual dorsal displacement of the talus is also present. IMPRESSION: 1. Partial reduction with residual lateral and posterior displacement of the talus. Electronically Signed   By: San Morelle M.D.   On: 10/16/2017 10:30   Dg Ankle 2 Views Left  Result Date: 10/16/2017 CLINICAL DATA:  Recent fall down stairs with left ankle deformity, initial encounter EXAM: LEFT ANKLE - 2 VIEW COMPARISON:  None. FINDINGS: There are changes consistent with a trimalleolar fracture with significant lateral dislocation of the talus with respect to the distal tibia. The distal aspect of the medial malleolus is adjacent to the displaced talus. No other definitive fracture is seen. IMPRESSION: Trimalleolar fracture with lateral dislocation of the talus. Electronically Signed   By: Inez Catalina M.D.   On: 10/16/2017 08:36    Review of Systems  Constitutional: Negative.   HENT: Negative.   Respiratory: Negative.   Cardiovascular: Negative.   Gastrointestinal: Negative.   Musculoskeletal:       Left ankle pain  Skin: Negative.   Neurological: Negative.   Psychiatric/Behavioral: Negative.     Blood pressure (!) 183/90, pulse 69, temperature 97.6 F (36.4  C), temperature source Oral, resp. rate 13, SpO2 98 %. Physical Exam  Constitutional: She appears well-developed.  HENT:  Head: Normocephalic.  Eyes: Conjunctivae are normal.  Neck: Neck supple.  Cardiovascular: Normal rate.  Respiratory: Effort normal.  GI: Soft.  Musculoskeletal:  Left lower extremity in short leg splint.  Toes exposed are swollen.  Her sensation of her toes.  Able to wiggle toes.  There is a well-healed total knee arthroplasty scar anterior knee on the left.  No tenderness palpation proximal splint or about the knee.  No tenderness palpation about the left thigh.  No pain with hip logroll.  Toes are warm and well-perfused.  She is able to move bilateral upper extremities without pain or difficulty.  No signs of injury. Right lower extremity without sign of injury.  No tenderness palpation about the foot, ankle, leg, knee or thigh or hip.  Skin: Skin is warm and dry.     Assessment/Plan Joanne Tran has an unreducible left ankle fracture dislocation.  Attempt at reduction was done in the emergency department was unsuccessful.  We will plan for open reduction and internal fixation of her left ankle.  Also will do an irrigation debridement of her site of open fracture.  She understands the risk, benefits and alternatives to surgery and is amenable to proceed.  She will be appropriate for discharge from the postoperative care unit after surgery.  Erle Crocker, MD 10/16/2017, 12:25 PM

## 2017-10-16 NOTE — OR Nursing (Signed)
Pt son is on his way back from a wedding and will be here as soon as he can to pick pt up.

## 2017-10-16 NOTE — ED Triage Notes (Signed)
Pt to ER for evaluation of left ankle deformity - reports fell at 1 am this morning, fell down 5 stairs, received 100 mcg of fentanyl and 8 mg zofran in route. Pt a/o x4 on arrival, in no pain. Denies LOC. Denies pain anywhere else.

## 2017-10-16 NOTE — Transfer of Care (Signed)
Immediate Anesthesia Transfer of Care Note  Patient: Joanne Tran  Procedure(s) Performed: OPEN REDUCTION INTERNAL FIXATION (ORIF) ANKLE FRACTURE (Left Ankle)  Patient Location: PACU  Anesthesia Type:General  Level of Consciousness: awake, alert , oriented and patient cooperative  Airway & Oxygen Therapy: Patient Spontanous Breathing and Patient connected to nasal cannula oxygen  Post-op Assessment: Report given to RN and Post -op Vital signs reviewed and stable  Post vital signs: Reviewed and stable  Last Vitals:  Vitals Value Taken Time  BP    Temp    Pulse 69 10/16/2017  3:20 PM  Resp 16 10/16/2017  3:20 PM  SpO2 98 % 10/16/2017  3:20 PM  Vitals shown include unvalidated device data.  Last Pain:  Vitals:   10/16/17 0737  TempSrc: Oral  PainSc:          Complications: No apparent anesthesia complications

## 2017-10-16 NOTE — OR Nursing (Signed)
Called and left a message for pt son stated that pt is ready to go home at this time.

## 2017-10-16 NOTE — Discharge Instructions (Signed)
DR. [facility] POST-OP INSTRUCTIONS ° ° °Pain Management °1. The numbing medicine and your leg will last around 18 hours, take a dose of your pain medicine as soon as you feel it wearing off to avoid rebound pain. °2. Keep your foot elevated above heart level.  Make sure that your heel hangs free ('floats'). °3. Take all prescribed medication as directed. °4. If taking narcotic pain medication you may want to use an over-the-counter stool softener to avoid constipation. °5. You may take over-the-counter NSAIDs (ibuprofen, naproxen, etc.) as well as over-the-counter acetaminophen as directed on the packaging as a supplement for your pain and may also use it to wean away from the prescription medication. ° °Activity ° °? Non-weightbearing ° ° °First Postoperative Visit °1. Your first postop visit will be at least 2 weeks after surgery.  This should be scheduled when you schedule surgery. °2. If you do not have a postoperative visit scheduled please call [phone] to schedule an appointment. °3. At the appointment your incision will be evaluated for suture removal, x-rays will be obtained if necessary. ° °General Instructions °1. Swelling is very common after foot and ankle surgery.  It often takes 3 months for the foot and ankle to begin to feel comfortable.  Some amount of swelling will persist for 6-12 months. °2. DO NOT change the dressing.  If there is a problem with the dressing (too tight, loose, gets wet, etc.) please contact Dr. Zilda No's office. °3. DO NOT get the dressing wet.  For showers you can use an over-the-counter cast cover or wrap a washcloth around the top of your dressing and then cover it with a plastic bag and tape it to your leg. °4. DO NOT soak the incision (no tubs, pools, bath, etc.) until you have approval from Dr. Demarrio Menges. ° °Contact Dr. Adairs office or go to Emergency Room if: °1. Temperature above 101° F. °2. Increasing pain that is unresponsive to pain medication or  elevation °3. Excessive redness or swelling in your foot °4. Dressing problems - excessive bloody drainage, looseness or tightness, or if dressing gets wet °5. Develop pain, swelling, warmth, or discoloration of your calf ° °

## 2017-10-16 NOTE — Progress Notes (Signed)
Orthopedic Tech Progress Note Patient Details:  Joanne Tran 1940/12/02 [id number]  Ortho Devices Type of Ortho Device: Ace wrap, Post (short leg) splint, Stirrup splint Ortho Device/Splint Location: lle   Post Interventions Patient Tolerated: Well Instructions Provided: Care of device   [name] 10/16/2017, 10:06 AM

## 2017-10-16 NOTE — Anesthesia Procedure Notes (Signed)
Anesthesia Regional Block: Popliteal block   Pre-Anesthetic Checklist: ,, timeout performed, Correct Patient, Correct Site, Correct Laterality, Correct Procedure, Correct Position, site marked, Risks and benefits discussed, pre-op evaluation,  At surgeon's request and post-op pain management  Laterality: Left  Prep: Maximum Sterile Barrier Precautions used, chloraprep       Needles:  Injection technique: Single-shot  Needle Type: Echogenic Stimulator Needle     Needle Length: 9cm  Needle Gauge: 21     Additional Needles:   Procedures:, nerve stimulator,,, ultrasound used (permanent image in chart),,,,   Nerve Stimulator or Paresthesia:  Response: Peroneal,  Response: Tibial,   Additional Responses:   Narrative:  Start time: 10/16/2017 12:36 PM End time: 10/16/2017 12:46 PM Injection made incrementally with aspirations every 5 mL. Anesthesiologist: Roderic Palau, MD  Additional Notes: 2% Lidocaine skin wheel. Saphenous block with 10cc of 0.5% Bupivicaine plain.

## 2017-10-16 NOTE — ED Provider Notes (Signed)
[facility] Provider Note   CSN: [id number] Arrival date & time: 10/16/17  0720     History   Chief Complaint Chief Complaint  Patient presents with  . Ankle Injury    HPI Joanne Tran is a 77 y.o. female.  HPI  Patient presents after a fall that occurred about 6 hours prior to ED arrival. Patient notes that Joanne Tran has had mild URI illness, but otherwise was in her usual state of health when Joanne Tran had a mechanical fall on steps. Joanne Tran suffered an injury to her left ankle, since that time has been unable to ambulate, with severe pain in the ankle. Pain is sharp, diffuse. Pain improved after receiving 100 mcg of fentanyl in route. No distal loss of sensation or weakness.   Past Medical History:  Diagnosis Date  . Anxiety    uses prozac  . Arthritis    "both knees" (08/06/2015)  . Cervical spondylosis   . Essential hypertension   . Gout   . Heart murmur    mitral valve prolapse with murmur  . Hiatal hernia   . History of mitral valve prolapse   . Hypertension   . Hypothyroidism   . Major depression   . Spinal headache 1970s   spinal headaches when they removed rectal polyps; "they had done a spinal tap"  . Thyroid cancer ([place]) 03/2007    Patient Active Problem List   Diagnosis Date Noted  . Chronic pain of right knee 08/25/2017  . Symptomatic cholelithiasis 08/06/2015  . Primary osteoarthritis of left knee 06/11/2015  . S/P total knee replacement using cement 06/11/2015  . MITRAL VALVE PROLAPSE 11/16/2007  . HIATAL HERNIA 11/16/2007  . COUGH 11/16/2007  . THYROID CANCER, HX OF 11/16/2007    Past Surgical History:  Procedure Laterality Date  . ABDOMINAL HYSTERECTOMY  ~ 1975  . APPENDECTOMY  1950  . CARDIAC CATHETERIZATION  03/08/2003   Dr. Terrence Dupont  . CHOLECYSTECTOMY N/A 08/07/2015   Procedure: LAPAROSCOPIC CHOLECYSTECTOMY;  Surgeon: Judeth Horn, MD;  Location: [facility];  Service: General;  Laterality: N/A;  . DILATION AND  CURETTAGE OF UTERUS  multiple  . JOINT REPLACEMENT    . TONSILLECTOMY  1950s  . TOTAL KNEE ARTHROPLASTY Left 06/11/2015   Procedure: LEFT TOTAL KNEE ARTHROPLASTY;  Surgeon: Garald Balding, MD;  Location: [facility];  Service: Orthopedics;  Laterality: Left;  . TOTAL THYROIDECTOMY  03/2007   Radioactive iodine therapy Dr. Chalmers Cater     OB History   None      Home Medications    Prior to Admission medications   Medication Sig Start Date End Date Taking? Authorizing Provider  acetaminophen (TYLENOL) 325 MG tablet Take 2 tablets (650 mg total) by mouth every 6 (six) hours as needed. 06/13/15   Cherylann Ratel, PA-C  allopurinol (ZYLOPRIM) 100 MG tablet  10/12/15   [provider]  amoxicillin (AMOXIL) 500 MG capsule  09/16/15   [provider]  atenolol (TENORMIN) 50 MG tablet Take 50 mg by mouth daily.    [provider]  colchicine 0.6 MG tablet Take 1 tablet (0.6 mg total) by mouth 2 (two) times daily as needed (FOR GOUT ATTACK). 06/13/15   Cherylann Ratel, PA-C  FLUoxetine (PROZAC) 20 MG capsule Take 20 mg by mouth daily.    [provider]  hydrochlorothiazide (HYDRODIURIL) 25 MG tablet Take 25 mg by mouth daily.    [provider]  levothyroxine (SYNTHROID, LEVOTHROID) 100 MCG  tablet  02/25/16   [provider]  lisinopril (PRINIVIL,ZESTRIL) 40 MG tablet Take 40 mg by mouth daily.    [provider]  ondansetron (ZOFRAN) 4 MG tablet Take 1 tablet (4 mg total) by mouth every 8 (eight) hours as needed for nausea. 08/08/15   Jill Alexanders, PA-C  pantoprazole (PROTONIX) 40 MG tablet  04/09/16   [provider]  polyethylene glycol (MIRALAX / GLYCOLAX) packet Take 17 g by mouth daily as needed for mild constipation. 08/08/15   Jill Alexanders, PA-C  rivaroxaban (XARELTO) 10 MG TABS tablet Take 1 tablet (10 mg total) by mouth daily with breakfast. Patient not taking: Reported on 11/13/2015 06/13/15   Cherylann Ratel, PA-C    simethicone (MYLICON) 80 MG chewable tablet Chew 0.5 tablets (40 mg total) by mouth every 6 (six) hours as needed for flatulence (bloating). 08/08/15   Jill Alexanders, PA-C    Family History Family History  Problem Relation Age of Onset  . Heart disease Father   . Heart attack Father   . Heart disease Mother   . Heart attack Mother   . Ovarian cancer Sister   . Heart disease Paternal Uncle        CABG    Social History Social History   Tobacco Use  . Smoking status: Never Smoker  . Smokeless tobacco: Never Used  Substance Use Topics  . Alcohol use: No    Alcohol/week: 0.0 standard drinks  . Drug use: No     Allergies   Codeine and Penicillins   Review of Systems Review of Systems  Constitutional:       Per HPI, otherwise negative  HENT:       Per HPI, otherwise negative  Respiratory:       Per HPI, otherwise negative  Cardiovascular:       Per HPI, otherwise negative  Gastrointestinal: Negative for vomiting.  Endocrine:       Negative aside from HPI  Genitourinary:       Neg aside from HPI   Musculoskeletal:       Per HPI, otherwise negative  Skin: Negative.  Negative for wound.  Neurological: Negative for syncope.     Physical Exam Updated Vital Signs BP (!) 194/85 (BP Location: Left Arm)   Pulse (!) 59   Temp 97.6 F (36.4 C) (Oral)   Resp 18   SpO2 100%   Physical Exam  Constitutional: Joanne Tran is oriented to person, place, and time. Joanne Tran appears well-developed and well-nourished. No distress.  HENT:  Head: Normocephalic and atraumatic.  Eyes: Conjunctivae and EOM are normal.  Cardiovascular: Normal rate and regular rhythm.  Pulmonary/Chest: Effort normal and breath sounds normal. No stridor. No respiratory distress.  Abdominal: Joanne Tran exhibits no distension.  Musculoskeletal: Joanne Tran exhibits no edema.       Left knee: Normal.       Left ankle: Joanne Tran exhibits decreased range of motion, swelling, ecchymosis, deformity and laceration. Joanne Tran exhibits  normal pulse. Tenderness.       Feet:  Neurological: Joanne Tran is alert and oriented to person, place, and time. No cranial nerve deficit.  Skin: Skin is warm and dry.  Puncture wound medial aspect proximal ankle  Psychiatric: Joanne Tran has a normal mood and affect.  Nursing note and vitals reviewed.    ED Treatments / Results  Labs (all labs ordered are listed, but only abnormal results are displayed) Labs Reviewed  BASIC METABOLIC PANEL  CBC WITH DIFFERENTIAL/PLATELET  EKG None  Radiology No results found.  Procedures .Sedation Date/Time: 10/16/2017 9:30 AM Performed by: Carmin Muskrat, MD Authorized by: Carmin Muskrat, MD   Consent:    Consent obtained:  Verbal   Consent given by:  Patient   Risks discussed:  Allergic reaction, dysrhythmia, inadequate sedation, nausea, prolonged hypoxia resulting in organ damage, prolonged sedation necessitating reversal, respiratory compromise necessitating ventilatory assistance and intubation and vomiting   Alternatives discussed:  Analgesia without sedation, anxiolysis and regional anesthesia Universal protocol:    Procedure explained and questions answered to patient or proxy's satisfaction: yes     Relevant documents present and verified: yes     Test results available and properly labeled: yes     Imaging studies available: yes     Required blood products, implants, devices, and special equipment available: yes     Site/side marked: yes     Immediately prior to procedure a time out was called: yes     Patient identity confirmation method:  Verbally with patient Indications:    Procedure necessitating sedation performed by:  Physician performing sedation Pre-sedation assessment:    Time since last food or drink:  12   ASA classification: class 2 - patient with mild systemic disease     Neck mobility: normal     Mouth opening:  2 finger widths   Thyromental distance:  2 finger widths   Mallampati score:  II - soft palate, uvula,  fauces visible   Pre-sedation assessments completed and reviewed: airway patency, cardiovascular function, hydration status, mental status, nausea/vomiting, pain level, respiratory function and temperature     Pre-sedation assessment completed:  10/16/2017 9:30 AM Immediate pre-procedure details:    Reassessment: Patient reassessed immediately prior to procedure     Reviewed: vital signs, relevant labs/tests and NPO status     Verified: bag valve mask available, emergency equipment available, intubation equipment available, IV patency confirmed, oxygen available and suction available   Procedure details (see MAR for exact dosages):    Preoxygenation:  Nasal cannula   Sedation:  Propofol   Analgesia:  Fentanyl   Intra-procedure monitoring:  Blood pressure monitoring, cardiac monitor, continuous pulse oximetry, frequent LOC assessments, frequent vital sign checks and continuous capnometry   Intra-procedure events: none     Total Provider sedation time (minutes):  20 Post-procedure details:    Post-sedation assessment completed:  10/16/2017 10:03 AM   Attendance: Constant attendance by certified staff until patient recovered     Recovery: Patient returned to pre-procedure baseline     Post-sedation assessments completed and reviewed: airway patency, cardiovascular function, hydration status, mental status, nausea/vomiting, pain level, respiratory function and temperature     Patient is stable for discharge or admission: yes     Patient tolerance:  Tolerated well, no immediate complications .Ortho Injury Treatment Date/Time: 10/16/2017 9:30 AM Performed by: Carmin Muskrat, MD Authorized by: Carmin Muskrat, MD   Consent:    Consent obtained:  Verbal   Consent given by:  Patient   Risks discussed:  Irreducible dislocation   Alternatives discussed:  Immobilization and no treatmentInjury location: ankle Location details: left ankle Injury type: fracture-dislocation Fracture type:  trimalleolar Pre-procedure neurovascular assessment: neurovascularly intact Pre-procedure distal perfusion: normal Pre-procedure neurological function: normal Pre-procedure range of motion: reduced  Anesthesia: Local anesthesia used: no  Patient sedated: Yes. Refer to sedation procedure documentation for details of sedation. Manipulation performed: yes Skin traction used: yes Reduction successful: yes X-ray confirmed reduction: Reduction partially successful, with substantial improvement, but with mild  persistent dislocation. Immobilization: splint Supplies used: plaster Post-procedure neurovascular assessment: post-procedure neurovascularly intact Post-procedure distal perfusion: normal Post-procedure neurological function: normal Post-procedure range of motion: unchanged Patient tolerance: Patient tolerated the procedure well with no immediate complications    (including critical care time)  Medications Ordered in ED Medications  fentaNYL (SUBLIMAZE) injection 50 mcg (50 mcg Intravenous Given 10/16/17 1143)  Tdap (BOOSTRIX) injection 0.5 mL (0.5 mLs Intramuscular Given 10/16/17 0807)  ceFAZolin (ANCEF) IVPB 1 g/50 mL premix (0 g Intravenous Stopped 10/16/17 0837)  propofol (DIPRIVAN) 10 mg/mL bolus/IV push 60 mg (60 mg Intravenous Given 10/16/17 0950)     Initial Impression / Assessment and Plan / ED Course  I have reviewed the triage vital signs and the nursing notes.  Pertinent labs & imaging results that were available during my care of the patient were reviewed by me and considered in my medical decision making (see chart for details).    11:42 AM Patient calm, in no distress.  I discussed her case again with her orthopedic team. Given concern for open fracture dislocation, incomplete reduction, though with improvement, the patient will go to the OR for definitive care. Patient continues to deny other complaints, remains hemodynamically unremarkable, is awake,  alert. Joanne Tran now confirms that Joanne Tran is not taking a blood thinning medication currently, but was only transiently on 1 4 prophylaxis during knee replacement.   Final Clinical Impressions(s) / ED Diagnoses  Fall, initial encounter Open fracture dislocation, left ankle, initial encounter   Carmin Muskrat, MD 10/16/17 1143

## 2017-10-16 NOTE — Op Note (Signed)
Joanne Tran female 77 y.o. 10/16/2017  PreOperative Diagnosis:  Left trimalleolar ankle fracture dislocation  PostOperative Diagnosis:  Left trimalleolar ankle fracture dislocation Syndesmotic disruption with fracture of anterior portion of tibia at incisura fibula at the syndesmosis   Procedure(s) and Anesthesia Type: Open reduction internal fixation of trimalleolar ankle fracture without posterior malleolar fixation Open reduction internal fixation of syndesmosis  Surgeon: Erle Crocker   Assistants: None  Anesthesia: General LMA anesthesia  Findings: Displaced trimalleolar ankle fracture Anterior tibial incisura fibulae fracture with disruption of syndesmosis  Implants: Depew titanium small fragment set  Indications:76 y.o. female fell down the stairs and sustained the above injury.  She was not able to get up and therefore 911 was called and she was taken to the emergency department.  In the emergency department attempt at reduction was made of her trimalleolar ankle fracture dislocation and this was unsuccessful.  Orthopedics was consulted.  There was thought that the fracture was open given a small area of ecchymosis on the medial side of the ankle at the site of the medial malleolus fracture however this was a closed injury.  The patient was indicated for open reduction internal fixation of a trimalleolar ankle fracture with inspection of the syndesmosis.  The risks, benefits and alternatives to the surgery were discussed with the patient.  The risks included but were not limited to wound healing complications, infection, nonunion or malunion, need for second surgery, damage to surrounding structures.  After discussing these complications the patient opted to proceed with surgical intervention.  Procedure Detail: Patient was seen in the preoperative holding area where history and physical exam was done.  She was not found to have any other injuries with the  exception of her left trimalleolar ankle fracture dislocation.  The operative extremity was marked by myself.  The consent was signed by myself and the patient.  She was seen by anesthesia and a peripheral nerve block was completed.  She was taken to the operating room placed supine on the operative table.  General LMA anesthesia was induced without difficulty.  2 g of Ancef was given.  All bony prominences were well padded.  A small bump was placed under the left hip.  The left lower extremity was preprepped with CHG.  Then the left lower extremity was prepped and draped in the usual sterile fashion.  At this time the ankle was still dislocated and therefore using gentle traction and manipulation the ankle joint was relocated.  Then I made a 6 cm incision centered over the fibula fracture.  This was taken sharply down through skin subcutaneous tissue.  Blunt dissection was used to ensure that the superficial peroneal nerve was not in the surgical site and was not seen.  The fracture was quite distal.  There is some comminution at the fracture site.  Upon further inspection it was noted that there was a fracture of the distal tibia at the anterior portion of the incisor of fibula A.  This was mobilized and was found to be still attached to the syndesmotic ligaments and reduced with manipulation.  Then the fibula fracture was reduced with a pointed reduction clamp and an attempt at a lag screw was made however the bone was comminuted near the fracture site and this was abandoned.  Then a 6 hole one third tubular titanium plate was placed and confirmed on fluoroscopy to maintain the acceptable reduction of the fibula.  Then turned my attention to the medial malleolus.  There  is a very thin area of epidermal tissue overlying the medial malleolus fracture site.  This was nearly an open fracture.  I made an incision over the medial malleolus fracture down through the skin and subcutaneous tissue.  There is a large rush  of fracture hematoma.  Then this area was cleaned out and sharply I was able to take the incision down to the bone and expose the fracture site.  Using a rondure and a curette the fracture site was debrided of small devitalized bony fragments and periosteal tissue.  Through the fracture site is able to visualize into the joint space and there did not appear to be any cartilaginous damage on the medial aspect of the talus.  Then using a pointed reduction forcep I was able to reduce the medial malleolus fragment into a acceptable position and placed to 4-0 partially-threaded screws.  Then again using intraoperative fluoroscopy as able to assess reduction of the ankle joint and this was found to be acceptable.  Then on stress x-ray the syndesmosis was found to widen through the fracture site in the anterior tibia and a Weber clamp was placed across the fibula and tibia to compress across the syndesmosis.  Then a syndesmotic screw was placed.  The clamp was removed and again a stress x-ray was done which did not show any widening of the syndesmosis or medial clear space.  The medial and lateral wounds were irrigated with normal saline.  2-0 PDS suture was used to close the deep tissues.  3-0 Monocryl was used on the subcutaneous tissue and staples on the lateral side and 3-0 nylon used on the medial side.  Xeroform, soft dressing and short leg splint was placed.  All counts were correct at the end of the case.  There were no complications.  Post Op Instructions: She will be nonweightbearing on the left lower extremity. She will take an aspirin a day for DVT prophylaxis Pain control will be given. She will be discharged from PACU. She will follow-up in 2 weeks for wound check, x-rays out of splint and placement in a short leg cast.  Tourniquette Time: An hour and 10 minutes  Estimated Blood Loss:  less than 50 mL         Drains: none  Blood Given: none         Specimens: none       Complications:  *  No complications entered in OR log *         Disposition: PACU - hemodynamically stable.         Condition: stable

## 2017-10-16 NOTE — Anesthesia Procedure Notes (Signed)
Procedure Name: LMA Insertion Date/Time: 10/16/2017 1:14 PM Performed by: Kyung Rudd, CRNA Pre-anesthesia Checklist: Patient identified, Emergency Drugs available, Suction available, Patient being monitored and Timeout performed Patient Re-evaluated:Patient Re-evaluated prior to induction Oxygen Delivery Method: Circle system utilized Preoxygenation: Pre-oxygenation with 100% oxygen Induction Type: IV induction Placement Confirmation: positive ETCO2 and breath sounds checked- equal and bilateral Tube secured with: Tape Dental Injury: Teeth and Oropharynx as per pre-operative assessment

## 2017-10-16 NOTE — Anesthesia Postprocedure Evaluation (Signed)
Anesthesia Post Note  Patient: Joanne Tran  Procedure(s) Performed: OPEN REDUCTION INTERNAL FIXATION (ORIF) ANKLE FRACTURE (Left Ankle)     Patient location during evaluation: PACU Anesthesia Type: General and Regional Level of consciousness: awake and alert Pain management: pain level controlled Vital Signs Assessment: post-procedure vital signs reviewed and stable Respiratory status: spontaneous breathing, nonlabored ventilation and respiratory function stable Cardiovascular status: blood pressure returned to baseline and stable Postop Assessment: no apparent nausea or vomiting Anesthetic complications: no    Last Vitals:  Vitals:   10/16/17 1545 10/16/17 1556  BP: (!) 194/78 (!) 193/91  Pulse: 70   Resp: 13   Temp:    SpO2: 97%     Last Pain:  Vitals:   10/16/17 1609  TempSrc:   PainSc: 9                  Sharbel Sahagun,W. EDMOND

## 2017-10-16 NOTE — Anesthesia Preprocedure Evaluation (Addendum)
Anesthesia Evaluation  Patient identified by MRN, date of birth, ID band Patient awake    Reviewed: Allergy & Precautions, H&P , NPO status , Patient's Chart, lab work & pertinent test results, reviewed documented beta blocker date and time   Airway Mallampati: I  TM Distance: >3 FB Neck ROM: Full    Dental no notable dental hx. (+) Teeth Intact, Dental Advisory Given   Pulmonary neg pulmonary ROS,    Pulmonary exam normal breath sounds clear to auscultation       Cardiovascular hypertension, Pt. on medications and Pt. on home beta blockers + Valvular Problems/Murmurs MVP  Rhythm:Regular Rate:Normal     Neuro/Psych  Headaches, Anxiety Depression negative neurological ROS  negative psych ROS   GI/Hepatic Neg liver ROS, hiatal hernia,   Endo/Other  negative endocrine ROSHypothyroidism   Renal/GU negative Renal ROS  negative genitourinary   Musculoskeletal  (+) Arthritis , Osteoarthritis,    Abdominal   Peds  Hematology negative hematology ROS (+)   Anesthesia Other Findings   Reproductive/Obstetrics negative OB ROS                            Anesthesia Physical Anesthesia Plan  ASA: II  Anesthesia Plan: General   Post-op Pain Management:  Regional for Post-op pain   Induction: Intravenous  PONV Risk Score and Plan: 3 and Midazolam, Ondansetron and Dexamethasone  Airway Management Planned: LMA  Additional Equipment:   Intra-op Plan:   Post-operative Plan: Extubation in OR  Informed Consent: I have reviewed the patients History and Physical, chart, labs and discussed the procedure including the risks, benefits and alternatives for the proposed anesthesia with the patient or authorized representative who has indicated his/her understanding and acceptance.   Dental advisory given  Plan Discussed with: CRNA  Anesthesia Plan Comments:         Anesthesia Quick Evaluation

## 2017-10-16 NOTE — OR Nursing (Signed)
Daughter in law called and stated that they would be here in 67mins to pick pt up

## 2017-10-18 ENCOUNTER — Encounter (HOSPITAL_COMMUNITY): Payer: Self-pay | Admitting: Orthopaedic Surgery

## 2017-10-21 DIAGNOSIS — Z7982 Long term (current) use of aspirin: Secondary | ICD-10-CM | POA: Diagnosis not present

## 2017-10-21 DIAGNOSIS — Z4789 Encounter for other orthopedic aftercare: Secondary | ICD-10-CM | POA: Diagnosis not present

## 2017-10-21 DIAGNOSIS — S82001D Unspecified fracture of right patella, subsequent encounter for closed fracture with routine healing: Secondary | ICD-10-CM | POA: Diagnosis not present

## 2017-10-22 DIAGNOSIS — S82001D Unspecified fracture of right patella, subsequent encounter for closed fracture with routine healing: Secondary | ICD-10-CM | POA: Diagnosis not present

## 2017-10-22 DIAGNOSIS — Z299 Encounter for prophylactic measures, unspecified: Secondary | ICD-10-CM | POA: Diagnosis not present

## 2017-10-22 DIAGNOSIS — Z7982 Long term (current) use of aspirin: Secondary | ICD-10-CM | POA: Diagnosis not present

## 2017-10-22 DIAGNOSIS — I10 Essential (primary) hypertension: Secondary | ICD-10-CM | POA: Diagnosis not present

## 2017-10-22 DIAGNOSIS — Z6832 Body mass index (BMI) 32.0-32.9, adult: Secondary | ICD-10-CM | POA: Diagnosis not present

## 2017-10-22 DIAGNOSIS — R05 Cough: Secondary | ICD-10-CM | POA: Diagnosis not present

## 2017-10-22 DIAGNOSIS — S82899A Other fracture of unspecified lower leg, initial encounter for closed fracture: Secondary | ICD-10-CM | POA: Diagnosis not present

## 2017-10-27 DIAGNOSIS — Z7982 Long term (current) use of aspirin: Secondary | ICD-10-CM | POA: Diagnosis not present

## 2017-10-27 DIAGNOSIS — S82001D Unspecified fracture of right patella, subsequent encounter for closed fracture with routine healing: Secondary | ICD-10-CM | POA: Diagnosis not present

## 2017-10-29 DIAGNOSIS — S82852D Displaced trimalleolar fracture of left lower leg, subsequent encounter for closed fracture with routine healing: Secondary | ICD-10-CM | POA: Diagnosis not present

## 2017-11-02 DIAGNOSIS — Z7982 Long term (current) use of aspirin: Secondary | ICD-10-CM | POA: Diagnosis not present

## 2017-11-02 DIAGNOSIS — S82001D Unspecified fracture of right patella, subsequent encounter for closed fracture with routine healing: Secondary | ICD-10-CM | POA: Diagnosis not present

## 2017-11-05 DIAGNOSIS — S82001D Unspecified fracture of right patella, subsequent encounter for closed fracture with routine healing: Secondary | ICD-10-CM | POA: Diagnosis not present

## 2017-11-05 DIAGNOSIS — Z7982 Long term (current) use of aspirin: Secondary | ICD-10-CM | POA: Diagnosis not present

## 2017-11-09 DIAGNOSIS — S82001D Unspecified fracture of right patella, subsequent encounter for closed fracture with routine healing: Secondary | ICD-10-CM | POA: Diagnosis not present

## 2017-11-09 DIAGNOSIS — Z7982 Long term (current) use of aspirin: Secondary | ICD-10-CM | POA: Diagnosis not present

## 2017-11-11 DIAGNOSIS — Z7982 Long term (current) use of aspirin: Secondary | ICD-10-CM | POA: Diagnosis not present

## 2017-11-11 DIAGNOSIS — S82001D Unspecified fracture of right patella, subsequent encounter for closed fracture with routine healing: Secondary | ICD-10-CM | POA: Diagnosis not present

## 2017-11-12 DIAGNOSIS — Z7982 Long term (current) use of aspirin: Secondary | ICD-10-CM | POA: Diagnosis not present

## 2017-11-12 DIAGNOSIS — S82001D Unspecified fracture of right patella, subsequent encounter for closed fracture with routine healing: Secondary | ICD-10-CM | POA: Diagnosis not present

## 2017-11-17 DIAGNOSIS — Z7982 Long term (current) use of aspirin: Secondary | ICD-10-CM | POA: Diagnosis not present

## 2017-11-17 DIAGNOSIS — S82001D Unspecified fracture of right patella, subsequent encounter for closed fracture with routine healing: Secondary | ICD-10-CM | POA: Diagnosis not present

## 2017-11-18 DIAGNOSIS — S82001D Unspecified fracture of right patella, subsequent encounter for closed fracture with routine healing: Secondary | ICD-10-CM | POA: Diagnosis not present

## 2017-11-18 DIAGNOSIS — Z7982 Long term (current) use of aspirin: Secondary | ICD-10-CM | POA: Diagnosis not present

## 2017-11-22 DIAGNOSIS — Z7982 Long term (current) use of aspirin: Secondary | ICD-10-CM | POA: Diagnosis not present

## 2017-11-22 DIAGNOSIS — S82852D Displaced trimalleolar fracture of left lower leg, subsequent encounter for closed fracture with routine healing: Secondary | ICD-10-CM | POA: Diagnosis not present

## 2017-11-22 DIAGNOSIS — S82001D Unspecified fracture of right patella, subsequent encounter for closed fracture with routine healing: Secondary | ICD-10-CM | POA: Diagnosis not present

## 2017-11-24 DIAGNOSIS — S82001D Unspecified fracture of right patella, subsequent encounter for closed fracture with routine healing: Secondary | ICD-10-CM | POA: Diagnosis not present

## 2017-11-24 DIAGNOSIS — Z7982 Long term (current) use of aspirin: Secondary | ICD-10-CM | POA: Diagnosis not present

## 2017-11-25 DIAGNOSIS — Z7982 Long term (current) use of aspirin: Secondary | ICD-10-CM | POA: Diagnosis not present

## 2017-11-25 DIAGNOSIS — S82001D Unspecified fracture of right patella, subsequent encounter for closed fracture with routine healing: Secondary | ICD-10-CM | POA: Diagnosis not present

## 2017-11-26 DIAGNOSIS — S82001D Unspecified fracture of right patella, subsequent encounter for closed fracture with routine healing: Secondary | ICD-10-CM | POA: Diagnosis not present

## 2017-11-26 DIAGNOSIS — Z7982 Long term (current) use of aspirin: Secondary | ICD-10-CM | POA: Diagnosis not present

## 2017-11-29 DIAGNOSIS — Z299 Encounter for prophylactic measures, unspecified: Secondary | ICD-10-CM | POA: Diagnosis not present

## 2017-11-29 DIAGNOSIS — I10 Essential (primary) hypertension: Secondary | ICD-10-CM | POA: Diagnosis not present

## 2017-11-29 DIAGNOSIS — Z7982 Long term (current) use of aspirin: Secondary | ICD-10-CM | POA: Diagnosis not present

## 2017-11-29 DIAGNOSIS — Z6832 Body mass index (BMI) 32.0-32.9, adult: Secondary | ICD-10-CM | POA: Diagnosis not present

## 2017-11-29 DIAGNOSIS — S82001D Unspecified fracture of right patella, subsequent encounter for closed fracture with routine healing: Secondary | ICD-10-CM | POA: Diagnosis not present

## 2017-12-01 DIAGNOSIS — S82001D Unspecified fracture of right patella, subsequent encounter for closed fracture with routine healing: Secondary | ICD-10-CM | POA: Diagnosis not present

## 2017-12-01 DIAGNOSIS — Z7982 Long term (current) use of aspirin: Secondary | ICD-10-CM | POA: Diagnosis not present

## 2017-12-03 DIAGNOSIS — Z7982 Long term (current) use of aspirin: Secondary | ICD-10-CM | POA: Diagnosis not present

## 2017-12-03 DIAGNOSIS — S82001D Unspecified fracture of right patella, subsequent encounter for closed fracture with routine healing: Secondary | ICD-10-CM | POA: Diagnosis not present

## 2017-12-07 DIAGNOSIS — Z7982 Long term (current) use of aspirin: Secondary | ICD-10-CM | POA: Diagnosis not present

## 2017-12-07 DIAGNOSIS — S82001D Unspecified fracture of right patella, subsequent encounter for closed fracture with routine healing: Secondary | ICD-10-CM | POA: Diagnosis not present

## 2017-12-08 DIAGNOSIS — Z7982 Long term (current) use of aspirin: Secondary | ICD-10-CM | POA: Diagnosis not present

## 2017-12-08 DIAGNOSIS — S82001D Unspecified fracture of right patella, subsequent encounter for closed fracture with routine healing: Secondary | ICD-10-CM | POA: Diagnosis not present

## 2017-12-09 DIAGNOSIS — Z7982 Long term (current) use of aspirin: Secondary | ICD-10-CM | POA: Diagnosis not present

## 2017-12-09 DIAGNOSIS — S82001D Unspecified fracture of right patella, subsequent encounter for closed fracture with routine healing: Secondary | ICD-10-CM | POA: Diagnosis not present

## 2017-12-10 DIAGNOSIS — Z7982 Long term (current) use of aspirin: Secondary | ICD-10-CM | POA: Diagnosis not present

## 2017-12-10 DIAGNOSIS — S82001D Unspecified fracture of right patella, subsequent encounter for closed fracture with routine healing: Secondary | ICD-10-CM | POA: Diagnosis not present

## 2017-12-14 DIAGNOSIS — S82001D Unspecified fracture of right patella, subsequent encounter for closed fracture with routine healing: Secondary | ICD-10-CM | POA: Diagnosis not present

## 2017-12-14 DIAGNOSIS — Z7982 Long term (current) use of aspirin: Secondary | ICD-10-CM | POA: Diagnosis not present

## 2017-12-16 DIAGNOSIS — Z299 Encounter for prophylactic measures, unspecified: Secondary | ICD-10-CM | POA: Diagnosis not present

## 2017-12-16 DIAGNOSIS — I10 Essential (primary) hypertension: Secondary | ICD-10-CM | POA: Diagnosis not present

## 2017-12-16 DIAGNOSIS — Z6832 Body mass index (BMI) 32.0-32.9, adult: Secondary | ICD-10-CM | POA: Diagnosis not present

## 2017-12-16 DIAGNOSIS — E039 Hypothyroidism, unspecified: Secondary | ICD-10-CM | POA: Diagnosis not present

## 2017-12-17 DIAGNOSIS — S82001D Unspecified fracture of right patella, subsequent encounter for closed fracture with routine healing: Secondary | ICD-10-CM | POA: Diagnosis not present

## 2017-12-17 DIAGNOSIS — Z7982 Long term (current) use of aspirin: Secondary | ICD-10-CM | POA: Diagnosis not present

## 2017-12-20 DIAGNOSIS — S82001D Unspecified fracture of right patella, subsequent encounter for closed fracture with routine healing: Secondary | ICD-10-CM | POA: Diagnosis not present

## 2017-12-20 DIAGNOSIS — R262 Difficulty in walking, not elsewhere classified: Secondary | ICD-10-CM | POA: Diagnosis not present

## 2017-12-20 DIAGNOSIS — Z4789 Encounter for other orthopedic aftercare: Secondary | ICD-10-CM | POA: Diagnosis not present

## 2017-12-20 DIAGNOSIS — Z7982 Long term (current) use of aspirin: Secondary | ICD-10-CM | POA: Diagnosis not present

## 2017-12-20 DIAGNOSIS — S82852D Displaced trimalleolar fracture of left lower leg, subsequent encounter for closed fracture with routine healing: Secondary | ICD-10-CM | POA: Diagnosis not present

## 2017-12-20 DIAGNOSIS — M6281 Muscle weakness (generalized): Secondary | ICD-10-CM | POA: Diagnosis not present

## 2017-12-21 DIAGNOSIS — M6281 Muscle weakness (generalized): Secondary | ICD-10-CM | POA: Diagnosis not present

## 2017-12-21 DIAGNOSIS — S82001D Unspecified fracture of right patella, subsequent encounter for closed fracture with routine healing: Secondary | ICD-10-CM | POA: Diagnosis not present

## 2017-12-21 DIAGNOSIS — Z7982 Long term (current) use of aspirin: Secondary | ICD-10-CM | POA: Diagnosis not present

## 2017-12-21 DIAGNOSIS — R262 Difficulty in walking, not elsewhere classified: Secondary | ICD-10-CM | POA: Diagnosis not present

## 2017-12-23 DIAGNOSIS — R262 Difficulty in walking, not elsewhere classified: Secondary | ICD-10-CM | POA: Diagnosis not present

## 2017-12-23 DIAGNOSIS — M6281 Muscle weakness (generalized): Secondary | ICD-10-CM | POA: Diagnosis not present

## 2017-12-23 DIAGNOSIS — Z7982 Long term (current) use of aspirin: Secondary | ICD-10-CM | POA: Diagnosis not present

## 2017-12-23 DIAGNOSIS — S82001D Unspecified fracture of right patella, subsequent encounter for closed fracture with routine healing: Secondary | ICD-10-CM | POA: Diagnosis not present

## 2017-12-28 DIAGNOSIS — Z7982 Long term (current) use of aspirin: Secondary | ICD-10-CM | POA: Diagnosis not present

## 2017-12-28 DIAGNOSIS — R262 Difficulty in walking, not elsewhere classified: Secondary | ICD-10-CM | POA: Diagnosis not present

## 2017-12-28 DIAGNOSIS — S82001D Unspecified fracture of right patella, subsequent encounter for closed fracture with routine healing: Secondary | ICD-10-CM | POA: Diagnosis not present

## 2017-12-28 DIAGNOSIS — M6281 Muscle weakness (generalized): Secondary | ICD-10-CM | POA: Diagnosis not present

## 2017-12-31 DIAGNOSIS — Z7982 Long term (current) use of aspirin: Secondary | ICD-10-CM | POA: Diagnosis not present

## 2017-12-31 DIAGNOSIS — R262 Difficulty in walking, not elsewhere classified: Secondary | ICD-10-CM | POA: Diagnosis not present

## 2017-12-31 DIAGNOSIS — M6281 Muscle weakness (generalized): Secondary | ICD-10-CM | POA: Diagnosis not present

## 2017-12-31 DIAGNOSIS — S82001D Unspecified fracture of right patella, subsequent encounter for closed fracture with routine healing: Secondary | ICD-10-CM | POA: Diagnosis not present

## 2018-01-03 DIAGNOSIS — R262 Difficulty in walking, not elsewhere classified: Secondary | ICD-10-CM | POA: Diagnosis not present

## 2018-01-03 DIAGNOSIS — S82001D Unspecified fracture of right patella, subsequent encounter for closed fracture with routine healing: Secondary | ICD-10-CM | POA: Diagnosis not present

## 2018-01-03 DIAGNOSIS — Z7982 Long term (current) use of aspirin: Secondary | ICD-10-CM | POA: Diagnosis not present

## 2018-01-03 DIAGNOSIS — M6281 Muscle weakness (generalized): Secondary | ICD-10-CM | POA: Diagnosis not present

## 2018-01-10 DIAGNOSIS — M6281 Muscle weakness (generalized): Secondary | ICD-10-CM | POA: Diagnosis not present

## 2018-01-10 DIAGNOSIS — R262 Difficulty in walking, not elsewhere classified: Secondary | ICD-10-CM | POA: Diagnosis not present

## 2018-01-10 DIAGNOSIS — S82001D Unspecified fracture of right patella, subsequent encounter for closed fracture with routine healing: Secondary | ICD-10-CM | POA: Diagnosis not present

## 2018-01-10 DIAGNOSIS — Z7982 Long term (current) use of aspirin: Secondary | ICD-10-CM | POA: Diagnosis not present

## 2018-01-18 DIAGNOSIS — Z0000 Encounter for general adult medical examination without abnormal findings: Secondary | ICD-10-CM | POA: Diagnosis not present

## 2018-01-18 DIAGNOSIS — Z79899 Other long term (current) drug therapy: Secondary | ICD-10-CM | POA: Diagnosis not present

## 2018-01-18 DIAGNOSIS — Z1339 Encounter for screening examination for other mental health and behavioral disorders: Secondary | ICD-10-CM | POA: Diagnosis not present

## 2018-01-18 DIAGNOSIS — I10 Essential (primary) hypertension: Secondary | ICD-10-CM | POA: Diagnosis not present

## 2018-01-18 DIAGNOSIS — M1990 Unspecified osteoarthritis, unspecified site: Secondary | ICD-10-CM | POA: Diagnosis not present

## 2018-01-18 DIAGNOSIS — E039 Hypothyroidism, unspecified: Secondary | ICD-10-CM | POA: Diagnosis not present

## 2018-01-18 DIAGNOSIS — Z6832 Body mass index (BMI) 32.0-32.9, adult: Secondary | ICD-10-CM | POA: Diagnosis not present

## 2018-01-18 DIAGNOSIS — Z299 Encounter for prophylactic measures, unspecified: Secondary | ICD-10-CM | POA: Diagnosis not present

## 2018-01-18 DIAGNOSIS — Z1211 Encounter for screening for malignant neoplasm of colon: Secondary | ICD-10-CM | POA: Diagnosis not present

## 2018-01-18 DIAGNOSIS — Z1331 Encounter for screening for depression: Secondary | ICD-10-CM | POA: Diagnosis not present

## 2018-01-18 DIAGNOSIS — Z7189 Other specified counseling: Secondary | ICD-10-CM | POA: Diagnosis not present

## 2018-02-14 DIAGNOSIS — S82852D Displaced trimalleolar fracture of left lower leg, subsequent encounter for closed fracture with routine healing: Secondary | ICD-10-CM | POA: Diagnosis not present

## 2018-04-13 DIAGNOSIS — E039 Hypothyroidism, unspecified: Secondary | ICD-10-CM | POA: Diagnosis not present

## 2018-04-21 DIAGNOSIS — Z299 Encounter for prophylactic measures, unspecified: Secondary | ICD-10-CM | POA: Diagnosis not present

## 2018-04-21 DIAGNOSIS — I10 Essential (primary) hypertension: Secondary | ICD-10-CM | POA: Diagnosis not present

## 2018-04-21 DIAGNOSIS — E039 Hypothyroidism, unspecified: Secondary | ICD-10-CM | POA: Diagnosis not present

## 2018-04-21 DIAGNOSIS — Z789 Other specified health status: Secondary | ICD-10-CM | POA: Diagnosis not present

## 2018-04-21 DIAGNOSIS — Z6832 Body mass index (BMI) 32.0-32.9, adult: Secondary | ICD-10-CM | POA: Diagnosis not present

## 2018-04-26 DIAGNOSIS — Z1231 Encounter for screening mammogram for malignant neoplasm of breast: Secondary | ICD-10-CM | POA: Diagnosis not present

## 2018-04-26 DIAGNOSIS — Z01419 Encounter for gynecological examination (general) (routine) without abnormal findings: Secondary | ICD-10-CM | POA: Diagnosis not present

## 2018-04-26 DIAGNOSIS — Z6832 Body mass index (BMI) 32.0-32.9, adult: Secondary | ICD-10-CM | POA: Diagnosis not present

## 2018-07-21 DIAGNOSIS — Z6833 Body mass index (BMI) 33.0-33.9, adult: Secondary | ICD-10-CM | POA: Diagnosis not present

## 2018-07-21 DIAGNOSIS — E039 Hypothyroidism, unspecified: Secondary | ICD-10-CM | POA: Diagnosis not present

## 2018-07-21 DIAGNOSIS — I10 Essential (primary) hypertension: Secondary | ICD-10-CM | POA: Diagnosis not present

## 2018-07-21 DIAGNOSIS — Z299 Encounter for prophylactic measures, unspecified: Secondary | ICD-10-CM | POA: Diagnosis not present

## 2018-07-21 DIAGNOSIS — K219 Gastro-esophageal reflux disease without esophagitis: Secondary | ICD-10-CM | POA: Diagnosis not present

## 2018-07-21 DIAGNOSIS — M1990 Unspecified osteoarthritis, unspecified site: Secondary | ICD-10-CM | POA: Diagnosis not present

## 2018-08-04 ENCOUNTER — Other Ambulatory Visit: Payer: Self-pay

## 2018-08-17 ENCOUNTER — Other Ambulatory Visit: Payer: Self-pay

## 2018-08-17 ENCOUNTER — Encounter: Payer: Self-pay | Admitting: Orthopaedic Surgery

## 2018-08-17 ENCOUNTER — Ambulatory Visit (INDEPENDENT_AMBULATORY_CARE_PROVIDER_SITE_OTHER): Payer: Medicare Other | Admitting: Orthopaedic Surgery

## 2018-08-17 DIAGNOSIS — M1711 Unilateral primary osteoarthritis, right knee: Secondary | ICD-10-CM | POA: Diagnosis not present

## 2018-08-17 MED ORDER — LIDOCAINE HCL 1 % IJ SOLN
2.0000 mL | INTRAMUSCULAR | Status: AC | PRN
  Administered 2018-08-17: 2 mL

## 2018-08-17 MED ORDER — BUPIVACAINE HCL 0.5 % IJ SOLN
2.0000 mL | INTRAMUSCULAR | Status: AC | PRN
  Administered 2018-08-17: 2 mL via INTRA_ARTICULAR

## 2018-08-17 MED ORDER — METHYLPREDNISOLONE ACETATE 40 MG/ML IJ SUSP
80.0000 mg | INTRAMUSCULAR | Status: AC | PRN
  Administered 2018-08-17: 15:00:00 80 mg via INTRA_ARTICULAR

## 2018-08-17 NOTE — Progress Notes (Signed)
Office Visit Note   Patient: Joanne Tran           Date of Birth: Aug 15, 1940           MRN: [id number] Visit Date: 08/17/2018              Requested by: Wallene Huh, [place] [address] [place],  [place] [address] PCP: Wallene Huh, MD   Assessment & Plan: Visit Diagnoses:  1. Unilateral primary osteoarthritis, right knee     Plan: Recurrent symptoms of osteoarthritis right knee.  Long discussion regarding treatment options.  Mrs. Folts would like to have another shot of cortisone  Follow-Up Instructions: Return if symptoms worsen or fail to improve.   Orders:  Orders Placed This Encounter  Procedures  . Large Joint Inj: R knee   No orders of the defined types were placed in this encounter.     Procedures: Large Joint Inj: R knee on 08/17/2018 3:04 PM Indications: pain and diagnostic evaluation Details: 25 G 1.5 in needle, anteromedial approach  Arthrogram: No  Medications: 2 mL lidocaine 1 %; 2 mL bupivacaine 0.5 %; 80 mg methylPREDNISolone acetate 40 MG/ML Procedure, treatment alternatives, risks and benefits explained, specific risks discussed. Consent was given by the patient. Immediately prior to procedure a time out was called to verify the correct patient, procedure, equipment, support staff and site/side marked as required. Patient was prepped and draped in the usual sterile fashion.       Clinical Data: No additional findings.   Subjective: Chief Complaint  Patient presents with  . Right Knee - Follow-up  Patient presents today for recurrent right knee pain. She received a cortisone injection in August of last year and states that it was helpful. She has been hurting again for a couple months. She takes Tylenol as needed. She would like to get another cortisone injection today.   HPI  Review of Systems   Objective: Vital Signs: Ht 5\' 3"  (1.6 m)   Wt 180 lb (81.6 kg)   BMI 31.89 kg/m   Physical Exam Constitutional:    Appearance: She is well-developed.  Eyes:     Pupils: Pupils are equal, round, and reactive to light.  Pulmonary:     Effort: Pulmonary effort is normal.  Skin:    General: Skin is warm and dry.  Neurological:     Mental Status: She is alert and oriented to person, place, and time.  Psychiatric:        Behavior: Behavior normal.     Ortho Exam predominately medial joint pain right knee.  Full extension and flexion over 105 degrees without instability.  No popliteal pain.  No calf pain.  Minimal patellar crepitation.  Possibly very minimal effusion.  Painless range of motion right hip  Specialty Comments:  No specialty comments available.  Imaging: No results found.   PMFS History: Patient Active Problem List   Diagnosis Date Noted  . Unilateral primary osteoarthritis, right knee 08/17/2018  . Chronic pain of right knee 08/25/2017  . Symptomatic cholelithiasis 08/06/2015  . Primary osteoarthritis of left knee 06/11/2015  . S/P total knee replacement using cement 06/11/2015  . MITRAL VALVE PROLAPSE 11/16/2007  . HIATAL HERNIA 11/16/2007  . COUGH 11/16/2007  . THYROID CANCER, HX OF 11/16/2007   Past Medical History:  Diagnosis Date  . Anxiety    uses prozac  . Arthritis    "both knees" (08/06/2015)  . Cervical spondylosis   . Essential hypertension   .  Gout   . Heart murmur    mitral valve prolapse with murmur  . Hiatal hernia   . History of mitral valve prolapse   . Hypertension   . Hypothyroidism   . Major depression   . Spinal headache 1970s   spinal headaches when they removed rectal polyps; "they had done a spinal tap"  . Thyroid cancer ([place]) 03/2007    Family History  Problem Relation Age of Onset  . Heart disease Father   . Heart attack Father   . Heart disease Mother   . Heart attack Mother   . Ovarian cancer Sister   . Heart disease Paternal Uncle        CABG    Past Surgical History:  Procedure Laterality Date  . ABDOMINAL HYSTERECTOMY  ~ 1975   . APPENDECTOMY  1950  . CARDIAC CATHETERIZATION  03/08/2003   Dr. Terrence Dupont  . CHOLECYSTECTOMY N/A 08/07/2015   Procedure: LAPAROSCOPIC CHOLECYSTECTOMY;  Surgeon: Judeth Horn, MD;  Location: [facility];  Service: General;  Laterality: N/A;  . DILATION AND CURETTAGE OF UTERUS  multiple  . JOINT REPLACEMENT    . ORIF ANKLE FRACTURE Left 10/16/2017   Procedure: OPEN REDUCTION INTERNAL FIXATION (ORIF) ANKLE FRACTURE;  Surgeon: Erle Crocker, MD;  Location: [facility];  Service: Orthopedics;  Laterality: Left;  . TONSILLECTOMY  1950s  . TOTAL KNEE ARTHROPLASTY Left 06/11/2015   Procedure: LEFT TOTAL KNEE ARTHROPLASTY;  Surgeon: Garald Balding, MD;  Location: [facility];  Service: Orthopedics;  Laterality: Left;  . TOTAL THYROIDECTOMY  03/2007   Radioactive iodine therapy Dr. Chalmers Cater   Social History   Occupational History  . Not on file  Tobacco Use  . Smoking status: Never Smoker  . Smokeless tobacco: Never Used  Substance and Sexual Activity  . Alcohol use: No    Alcohol/week: 0.0 standard drinks  . Drug use: No  . Sexual activity: Never

## 2018-09-26 DIAGNOSIS — Z23 Encounter for immunization: Secondary | ICD-10-CM | POA: Diagnosis not present

## 2018-10-21 DIAGNOSIS — E039 Hypothyroidism, unspecified: Secondary | ICD-10-CM | POA: Diagnosis not present

## 2018-10-21 DIAGNOSIS — Z299 Encounter for prophylactic measures, unspecified: Secondary | ICD-10-CM | POA: Diagnosis not present

## 2018-10-21 DIAGNOSIS — M1990 Unspecified osteoarthritis, unspecified site: Secondary | ICD-10-CM | POA: Diagnosis not present

## 2018-10-21 DIAGNOSIS — I10 Essential (primary) hypertension: Secondary | ICD-10-CM | POA: Diagnosis not present

## 2018-10-21 DIAGNOSIS — Z6831 Body mass index (BMI) 31.0-31.9, adult: Secondary | ICD-10-CM | POA: Diagnosis not present

## 2019-01-23 DIAGNOSIS — Z0000 Encounter for general adult medical examination without abnormal findings: Secondary | ICD-10-CM | POA: Diagnosis not present

## 2019-01-23 DIAGNOSIS — E039 Hypothyroidism, unspecified: Secondary | ICD-10-CM | POA: Diagnosis not present

## 2019-01-23 DIAGNOSIS — Z299 Encounter for prophylactic measures, unspecified: Secondary | ICD-10-CM | POA: Diagnosis not present

## 2019-01-23 DIAGNOSIS — Z1339 Encounter for screening examination for other mental health and behavioral disorders: Secondary | ICD-10-CM | POA: Diagnosis not present

## 2019-01-23 DIAGNOSIS — Z1211 Encounter for screening for malignant neoplasm of colon: Secondary | ICD-10-CM | POA: Diagnosis not present

## 2019-01-23 DIAGNOSIS — Z1331 Encounter for screening for depression: Secondary | ICD-10-CM | POA: Diagnosis not present

## 2019-01-23 DIAGNOSIS — Z79899 Other long term (current) drug therapy: Secondary | ICD-10-CM | POA: Diagnosis not present

## 2019-01-23 DIAGNOSIS — Z7189 Other specified counseling: Secondary | ICD-10-CM | POA: Diagnosis not present

## 2019-01-23 DIAGNOSIS — I10 Essential (primary) hypertension: Secondary | ICD-10-CM | POA: Diagnosis not present

## 2019-01-23 DIAGNOSIS — Z6833 Body mass index (BMI) 33.0-33.9, adult: Secondary | ICD-10-CM | POA: Diagnosis not present

## 2019-01-23 DIAGNOSIS — E7800 Pure hypercholesterolemia, unspecified: Secondary | ICD-10-CM | POA: Diagnosis not present

## 2019-02-14 DIAGNOSIS — H02831 Dermatochalasis of right upper eyelid: Secondary | ICD-10-CM | POA: Diagnosis not present

## 2019-02-14 DIAGNOSIS — H02835 Dermatochalasis of left lower eyelid: Secondary | ICD-10-CM | POA: Diagnosis not present

## 2019-02-14 DIAGNOSIS — H02832 Dermatochalasis of right lower eyelid: Secondary | ICD-10-CM | POA: Diagnosis not present

## 2019-02-14 DIAGNOSIS — H02834 Dermatochalasis of left upper eyelid: Secondary | ICD-10-CM | POA: Diagnosis not present

## 2019-02-28 DIAGNOSIS — Z20828 Contact with and (suspected) exposure to other viral communicable diseases: Secondary | ICD-10-CM | POA: Diagnosis not present

## 2019-03-22 DIAGNOSIS — E039 Hypothyroidism, unspecified: Secondary | ICD-10-CM | POA: Diagnosis not present

## 2019-03-22 DIAGNOSIS — I10 Essential (primary) hypertension: Secondary | ICD-10-CM | POA: Diagnosis not present

## 2019-03-22 DIAGNOSIS — F331 Major depressive disorder, recurrent, moderate: Secondary | ICD-10-CM | POA: Diagnosis not present

## 2019-03-22 DIAGNOSIS — Z789 Other specified health status: Secondary | ICD-10-CM | POA: Diagnosis not present

## 2019-03-22 DIAGNOSIS — E7800 Pure hypercholesterolemia, unspecified: Secondary | ICD-10-CM | POA: Diagnosis not present

## 2019-03-22 DIAGNOSIS — Z299 Encounter for prophylactic measures, unspecified: Secondary | ICD-10-CM | POA: Diagnosis not present

## 2019-03-22 DIAGNOSIS — Z6833 Body mass index (BMI) 33.0-33.9, adult: Secondary | ICD-10-CM | POA: Diagnosis not present

## 2019-04-20 DIAGNOSIS — Z299 Encounter for prophylactic measures, unspecified: Secondary | ICD-10-CM | POA: Diagnosis not present

## 2019-04-20 DIAGNOSIS — I10 Essential (primary) hypertension: Secondary | ICD-10-CM | POA: Diagnosis not present

## 2019-04-20 DIAGNOSIS — E039 Hypothyroidism, unspecified: Secondary | ICD-10-CM | POA: Diagnosis not present

## 2019-05-05 DIAGNOSIS — I10 Essential (primary) hypertension: Secondary | ICD-10-CM | POA: Diagnosis not present

## 2019-05-10 DIAGNOSIS — Z1231 Encounter for screening mammogram for malignant neoplasm of breast: Secondary | ICD-10-CM | POA: Diagnosis not present

## 2019-05-10 DIAGNOSIS — Z01419 Encounter for gynecological examination (general) (routine) without abnormal findings: Secondary | ICD-10-CM | POA: Diagnosis not present

## 2019-05-10 DIAGNOSIS — Z6831 Body mass index (BMI) 31.0-31.9, adult: Secondary | ICD-10-CM | POA: Diagnosis not present

## 2019-06-04 DIAGNOSIS — I10 Essential (primary) hypertension: Secondary | ICD-10-CM | POA: Diagnosis not present

## 2019-07-20 DIAGNOSIS — E039 Hypothyroidism, unspecified: Secondary | ICD-10-CM | POA: Diagnosis not present

## 2019-07-20 DIAGNOSIS — I10 Essential (primary) hypertension: Secondary | ICD-10-CM | POA: Diagnosis not present

## 2019-07-20 DIAGNOSIS — T466X5A Adverse effect of antihyperlipidemic and antiarteriosclerotic drugs, initial encounter: Secondary | ICD-10-CM | POA: Diagnosis not present

## 2019-07-20 DIAGNOSIS — F331 Major depressive disorder, recurrent, moderate: Secondary | ICD-10-CM | POA: Diagnosis not present

## 2019-07-20 DIAGNOSIS — M7552 Bursitis of left shoulder: Secondary | ICD-10-CM | POA: Diagnosis not present

## 2019-07-20 DIAGNOSIS — E7800 Pure hypercholesterolemia, unspecified: Secondary | ICD-10-CM | POA: Diagnosis not present

## 2019-07-20 DIAGNOSIS — Z299 Encounter for prophylactic measures, unspecified: Secondary | ICD-10-CM | POA: Diagnosis not present

## 2019-08-14 DIAGNOSIS — K219 Gastro-esophageal reflux disease without esophagitis: Secondary | ICD-10-CM | POA: Diagnosis not present

## 2019-08-14 DIAGNOSIS — R42 Dizziness and giddiness: Secondary | ICD-10-CM | POA: Diagnosis not present

## 2019-08-14 DIAGNOSIS — F419 Anxiety disorder, unspecified: Secondary | ICD-10-CM | POA: Diagnosis not present

## 2019-08-14 DIAGNOSIS — Z049 Encounter for examination and observation for unspecified reason: Secondary | ICD-10-CM | POA: Diagnosis not present

## 2019-08-14 DIAGNOSIS — D649 Anemia, unspecified: Secondary | ICD-10-CM | POA: Diagnosis not present

## 2019-08-14 DIAGNOSIS — I10 Essential (primary) hypertension: Secondary | ICD-10-CM | POA: Diagnosis not present

## 2019-08-14 DIAGNOSIS — I951 Orthostatic hypotension: Secondary | ICD-10-CM | POA: Diagnosis not present

## 2019-08-14 DIAGNOSIS — R55 Syncope and collapse: Secondary | ICD-10-CM | POA: Diagnosis not present

## 2019-09-18 DIAGNOSIS — Z6834 Body mass index (BMI) 34.0-34.9, adult: Secondary | ICD-10-CM | POA: Diagnosis not present

## 2019-09-18 DIAGNOSIS — Z299 Encounter for prophylactic measures, unspecified: Secondary | ICD-10-CM | POA: Diagnosis not present

## 2019-09-18 DIAGNOSIS — E039 Hypothyroidism, unspecified: Secondary | ICD-10-CM | POA: Diagnosis not present

## 2019-09-18 DIAGNOSIS — I10 Essential (primary) hypertension: Secondary | ICD-10-CM | POA: Diagnosis not present

## 2019-09-18 DIAGNOSIS — F331 Major depressive disorder, recurrent, moderate: Secondary | ICD-10-CM | POA: Diagnosis not present

## 2019-09-18 DIAGNOSIS — R5383 Other fatigue: Secondary | ICD-10-CM | POA: Diagnosis not present

## 2019-10-25 DIAGNOSIS — Z299 Encounter for prophylactic measures, unspecified: Secondary | ICD-10-CM | POA: Diagnosis not present

## 2019-10-25 DIAGNOSIS — F331 Major depressive disorder, recurrent, moderate: Secondary | ICD-10-CM | POA: Diagnosis not present

## 2019-10-25 DIAGNOSIS — I10 Essential (primary) hypertension: Secondary | ICD-10-CM | POA: Diagnosis not present

## 2019-10-25 DIAGNOSIS — E039 Hypothyroidism, unspecified: Secondary | ICD-10-CM | POA: Diagnosis not present

## 2019-10-25 DIAGNOSIS — Z6834 Body mass index (BMI) 34.0-34.9, adult: Secondary | ICD-10-CM | POA: Diagnosis not present

## 2019-10-25 DIAGNOSIS — Z23 Encounter for immunization: Secondary | ICD-10-CM | POA: Diagnosis not present

## 2020-06-05 ENCOUNTER — Encounter: Payer: Self-pay | Admitting: Physician Assistant

## 2020-07-25 ENCOUNTER — Ambulatory Visit: Payer: Medicare Other | Admitting: Physician Assistant

## 2020-11-08 LAB — IRON,TIBC AND FERRITIN PANEL
%SAT: 26
Ferritin: 27.2
Iron: 79
TIBC: 308
UIBC: 229

## 2020-11-08 LAB — VITAMIN D 25 HYDROXY (VIT D DEFICIENCY, FRACTURES): Vit D, 25-Hydroxy: 19

## 2020-11-08 LAB — TSH: TSH: 11.85 — AB (ref 0.41–5.90)

## 2020-12-03 ENCOUNTER — Other Ambulatory Visit (HOSPITAL_COMMUNITY): Payer: Self-pay | Admitting: *Deleted

## 2020-12-04 ENCOUNTER — Encounter (HOSPITAL_COMMUNITY)
Admission: RE | Admit: 2020-12-04 | Discharge: 2020-12-04 | Disposition: A | Discharge status: Home / Self Care | Payer: Medicare Other | Source: Ambulatory Visit | Attending: Internal Medicine | Admitting: Internal Medicine

## 2020-12-04 ENCOUNTER — Ambulatory Visit: Payer: Medicare Other | Admitting: Orthopaedic Surgery

## 2020-12-04 DIAGNOSIS — D649 Anemia, unspecified: Secondary | ICD-10-CM | POA: Insufficient documentation

## 2020-12-04 MED ORDER — SODIUM CHLORIDE 0.9 % IV SOLN
510.0000 mg | INTRAVENOUS | Status: DC
  Administered 2020-12-04: 510 mg via INTRAVENOUS
  Filled 2020-12-04: qty 17

## 2020-12-11 ENCOUNTER — Encounter (HOSPITAL_COMMUNITY)
Admission: RE | Admit: 2020-12-11 | Discharge: 2020-12-11 | Disposition: A | Discharge status: Home / Self Care | Payer: Medicare Other | Source: Ambulatory Visit | Attending: Internal Medicine | Admitting: Internal Medicine

## 2020-12-11 DIAGNOSIS — D649 Anemia, unspecified: Secondary | ICD-10-CM | POA: Insufficient documentation

## 2020-12-11 MED ORDER — SODIUM CHLORIDE 0.9 % IV SOLN
510.0000 mg | INTRAVENOUS | Status: DC
  Administered 2020-12-11: 510 mg via INTRAVENOUS
  Filled 2020-12-11: qty 510

## 2020-12-18 ENCOUNTER — Ambulatory Visit (INDEPENDENT_AMBULATORY_CARE_PROVIDER_SITE_OTHER): Payer: Medicare Other | Admitting: Orthopaedic Surgery

## 2020-12-18 ENCOUNTER — Encounter: Payer: Self-pay | Admitting: Orthopaedic Surgery

## 2020-12-18 ENCOUNTER — Other Ambulatory Visit: Payer: Self-pay

## 2020-12-18 DIAGNOSIS — M1711 Unilateral primary osteoarthritis, right knee: Secondary | ICD-10-CM

## 2020-12-18 MED ORDER — METHYLPREDNISOLONE ACETATE 40 MG/ML IJ SUSP
80.0000 mg | INTRAMUSCULAR | Status: AC | PRN
  Administered 2020-12-18: 15:00:00 80 mg via INTRA_ARTICULAR

## 2020-12-18 MED ORDER — LIDOCAINE HCL 1 % IJ SOLN
2.0000 mL | INTRAMUSCULAR | Status: AC | PRN
  Administered 2020-12-18: 15:00:00 2 mL

## 2020-12-18 MED ORDER — BUPIVACAINE HCL 0.25 % IJ SOLN
2.0000 mL | INTRAMUSCULAR | Status: AC | PRN
  Administered 2020-12-18: 15:00:00 2 mL via INTRA_ARTICULAR

## 2020-12-18 NOTE — Progress Notes (Signed)
Office Visit Note   Patient: Joanne Tran           Date of Birth: [birth year]           MRN: [id number] Visit Date: 12/18/2020              Requested by: Sueanne Margarita, [address] [place],  [place] [address] PCP: Sueanne Margarita, DO   Assessment & Plan: Visit Diagnoses:  1. Unilateral primary osteoarthritis, right knee     Plan: Mrs. Brunelli is having recurrent symptoms of osteoarthritis right knee.  She has had prior films consistent with osteoarthritis and prior cortisone injections with excellent response.  Is been several years since her last injection so we will proceed with a cortisone injection on the medial compartment  Follow-Up Instructions: Return if symptoms worsen or fail to improve.   Orders:  Orders Placed This Encounter  Procedures   Large Joint Inj: R knee   No orders of the defined types were placed in this encounter.     Procedures: Large Joint Inj: R knee on 12/18/2020 2:40 PM Indications: pain and diagnostic evaluation Details: 25 G 1.5 in needle, anteromedial approach  Arthrogram: No  Medications: 2 mL lidocaine 1 %; 80 mg methylPREDNISolone acetate 40 MG/ML; 2 mL bupivacaine 0.25 % Procedure, treatment alternatives, risks and benefits explained, specific risks discussed. Consent was given by the patient. Immediately prior to procedure a time out was called to verify the correct patient, procedure, equipment, support staff and site/side marked as required. Patient was prepped and draped in the usual sterile fashion.      Clinical Data: No additional findings.   Subjective: Chief Complaint  Patient presents with   Right Knee - Pain  Patient presents today for right knee pain. She said that she is wanting a cortisone injection today. Her last injection was given in 2020. She takes Tylenol Extra Strength, but states that it does not help like it use to.  HPI  Review of Systems   Objective: Vital Signs: There were no vitals taken for  this visit.  Physical Exam Constitutional:      Appearance: She is well-developed.  Pulmonary:     Effort: Pulmonary effort is normal.  Skin:    General: Skin is warm and dry.  Neurological:     Mental Status: She is alert and oriented to person, place, and time.  Psychiatric:        Behavior: Behavior normal.    Ortho Exam right knee was not hot red or swollen.  No obvious effusion but does have large knees.  Mostly medial joint pain.  No pain laterally.  Full extension flexed over 100 degrees without any instability.  Some patella crepitation but no pain with patella compression.  Specialty Comments:  No specialty comments available.  Imaging: No results found.   PMFS History: Patient Active Problem List   Diagnosis Date Noted   Unilateral primary osteoarthritis, right knee 08/17/2018   Chronic pain of right knee 08/25/2017   Symptomatic cholelithiasis 08/06/2015   Primary osteoarthritis of left knee 06/11/2015   S/P total knee replacement using cement 06/11/2015   MITRAL VALVE PROLAPSE 11/16/2007   HIATAL HERNIA 11/16/2007   COUGH 11/16/2007   THYROID CANCER, HX OF 11/16/2007   Past Medical History:  Diagnosis Date   Anxiety    uses prozac   Arthritis    "both knees" (08/06/2015)   Cervical spondylosis    Essential hypertension    Gout  Heart murmur    mitral valve prolapse with murmur   Hiatal hernia    History of mitral valve prolapse    Hypertension    Hypothyroidism    Major depression    Spinal headache 1970s   spinal headaches when they removed rectal polyps; "they had done a spinal tap"   Thyroid cancer ([place]) 03/2007    Family History  Problem Relation Age of Onset   Heart disease Father    Heart attack Father    Heart disease Mother    Heart attack Mother    Ovarian cancer Sister    Heart disease Paternal Uncle        CABG    Past Surgical History:  Procedure Laterality Date   ABDOMINAL HYSTERECTOMY  ~ [place]  03/08/2003   Dr. Terrence Dupont   CHOLECYSTECTOMY N/A 08/07/2015   Procedure: LAPAROSCOPIC CHOLECYSTECTOMY;  Surgeon: Judeth Horn, MD;  Location: [facility];  Service: General;  Laterality: N/A;   DILATION AND CURETTAGE OF UTERUS  multiple   JOINT REPLACEMENT     ORIF ANKLE FRACTURE Left 10/16/2017   Procedure: OPEN REDUCTION INTERNAL FIXATION (ORIF) ANKLE FRACTURE;  Surgeon: Erle Crocker, MD;  Location: [facility];  Service: Orthopedics;  Laterality: Left;   TONSILLECTOMY  1950s   TOTAL KNEE ARTHROPLASTY Left 06/11/2015   Procedure: LEFT TOTAL KNEE ARTHROPLASTY;  Surgeon: Garald Balding, MD;  Location: [facility];  Service: Orthopedics;  Laterality: Left;   TOTAL THYROIDECTOMY  03/2007   Radioactive iodine therapy Dr. Chalmers Cater   Social History   Occupational History   Not on file  Tobacco Use   Smoking status: Never   Smokeless tobacco: Never  Vaping Use   Vaping Use: Never used  Substance and Sexual Activity   Alcohol use: No    Alcohol/week: 0.0 standard drinks   Drug use: No   Sexual activity: Never

## 2021-01-09 ENCOUNTER — Other Ambulatory Visit: Payer: Self-pay

## 2021-01-09 DIAGNOSIS — L304 Erythema intertrigo: Secondary | ICD-10-CM | POA: Insufficient documentation

## 2021-01-09 DIAGNOSIS — E785 Hyperlipidemia, unspecified: Secondary | ICD-10-CM | POA: Insufficient documentation

## 2021-01-09 DIAGNOSIS — E559 Vitamin D deficiency, unspecified: Secondary | ICD-10-CM | POA: Insufficient documentation

## 2021-01-09 DIAGNOSIS — I700 Atherosclerosis of aorta: Secondary | ICD-10-CM | POA: Insufficient documentation

## 2021-01-09 DIAGNOSIS — D509 Iron deficiency anemia, unspecified: Secondary | ICD-10-CM | POA: Insufficient documentation

## 2021-01-09 DIAGNOSIS — I341 Nonrheumatic mitral (valve) prolapse: Secondary | ICD-10-CM | POA: Insufficient documentation

## 2021-01-09 DIAGNOSIS — M8580 Other specified disorders of bone density and structure, unspecified site: Secondary | ICD-10-CM | POA: Insufficient documentation

## 2021-01-09 DIAGNOSIS — I10 Essential (primary) hypertension: Secondary | ICD-10-CM | POA: Insufficient documentation

## 2021-01-17 ENCOUNTER — Other Ambulatory Visit (INDEPENDENT_AMBULATORY_CARE_PROVIDER_SITE_OTHER): Payer: Medicare Other

## 2021-01-17 ENCOUNTER — Ambulatory Visit: Payer: Medicare Other | Admitting: Nurse Practitioner

## 2021-01-17 ENCOUNTER — Encounter: Payer: Self-pay | Admitting: Nurse Practitioner

## 2021-01-17 VITALS — BP 110/84 | HR 66 | Ht 63.0 in | Wt 178.0 lb

## 2021-01-17 DIAGNOSIS — D509 Iron deficiency anemia, unspecified: Secondary | ICD-10-CM

## 2021-01-17 DIAGNOSIS — R159 Full incontinence of feces: Secondary | ICD-10-CM | POA: Diagnosis not present

## 2021-01-17 LAB — CBC
HCT: 44.7 % (ref 36.0–46.0)
Hemoglobin: 14.8 g/dL (ref 12.0–15.0)
MCHC: 33 g/dL (ref 30.0–36.0)
MCV: 92.2 fl (ref 78.0–100.0)
Platelets: 281 10*3/uL (ref 150.0–400.0)
RBC: 4.85 Mil/uL (ref 3.87–5.11)
RDW: 15.5 % (ref 11.5–15.5)
WBC: 7.8 10*3/uL (ref 4.0–10.5)

## 2021-01-17 MED ORDER — NA SULFATE-K SULFATE-MG SULF 17.5-3.13-1.6 GM/177ML PO SOLN: 1.0000 | ORAL | 0 refills | Status: DC

## 2021-01-17 NOTE — Progress Notes (Signed)
01/17/2021 Joanne Tran [id number] [birth year]   CHIEF COMPLAINT: Anemia   HISTORY OF PRESENT ILLNESS: Joanne Tran is a 81 year old female with a past medical history of depression, thyroid cancer s/p thyroidectomy resulting in postsurgical hypothyroidism, hypertension, hyperlipidemia, MVP, gout, osteopenia and iron deficiency anemia. Past appendectomy, cholecystectomy and hysterectomy. She presents to our office today as referred by Dr. Francesco Sor for further evaluation regarding iron deficiency anemia. She reported receiving 2 iron infusions about one month ago. No blood transfusions. She is intolerant to oral iron, resulted in diarrhea. Note, I do not have access to her most recent CBC and panel result. See March and May 2022 results below. She denies having any dysphagia or heartburn. However, she was on Pantoprazole 40mg  QD for GERD symptoms and questionable past food impaction episode per records from Dr. Francesco Sor. Pantoprazole was discontinued 11/2020. She possibly underwent an EGD in the past, "too long ago to remember when". She is passing normal brown soft stool with urgency shortly after she eats. No specific food triggers. She takes Imodium as needed. She occasionally sees bright red blood on the toilet tissue which she attributes to having hemorrhoids. She stated having a colonoscopy by Dr. Doristine Mango at [facility] about 7 years ago and no further colonoscopies were recommended. She reported completing 3 colonoscopies in her lifetime which were normal, no polyps. No known family history of esophageal, gastric or colon cancer.   Labs 05/28/2020: Ferritin 53.4. Iron 58. Iron saturation 19. TIBC 308.  Labs 04/04/2020: Ferritin 42.7. Iron 101. Iron saturation 27. TIBC 372. B12 283.     Past Medical History:  Diagnosis Date   Anxiety    uses prozac   Arthritis    "both knees" (08/06/2015)   Cervical spondylosis    Essential hypertension    Gout    Heart murmur     mitral valve prolapse with murmur   Hiatal hernia    History of mitral valve prolapse    Hypertension    Hypothyroidism    Major depression    Spinal headache 1970s   spinal headaches when they removed rectal polyps; "they had done a spinal tap"   Thyroid cancer ([place]) 03/2007   Past Surgical History:  Procedure Laterality Date   ABDOMINAL HYSTERECTOMY  ~ [place]  03/08/2003   Dr. Terrence Dupont   CHOLECYSTECTOMY N/A 08/07/2015   Procedure: LAPAROSCOPIC CHOLECYSTECTOMY;  Surgeon: Judeth Horn, MD;  Location: [facility];  Service: General;  Laterality: N/A;   DILATION AND CURETTAGE OF UTERUS  multiple   JOINT REPLACEMENT     ORIF ANKLE FRACTURE Left 10/16/2017   Procedure: OPEN REDUCTION INTERNAL FIXATION (ORIF) ANKLE FRACTURE;  Surgeon: Erle Crocker, MD;  Location: [facility];  Service: Orthopedics;  Laterality: Left;   TONSILLECTOMY  1950s   TOTAL KNEE ARTHROPLASTY Left 06/11/2015   Procedure: LEFT TOTAL KNEE ARTHROPLASTY;  Surgeon: Garald Balding, MD;  Location: [facility];  Service: Orthopedics;  Laterality: Left;   TOTAL THYROIDECTOMY  03/2007   Radioactive iodine therapy Dr. Chalmers Cater   Social History:  She is widowed. She has one son. Non smoker. No alcohol use. No drug use.   Family History: Father died age 22 heart disease. Mother died age 62 heart disease. Sister with ovarian and  bladder cancer deceased after MI. No family history of esophageal, gastric or colon cancer.   Allergies  Allergen Reactions   Sulfamethoxazole-Trimethoprim  Other reaction(s): hives   Codeine Itching   Penicillins Rash    When she was younger   Sulfa Antibiotics Rash    Outpatient Encounter Medications as of 01/17/2021  Medication Sig   allopurinol (ZYLOPRIM) 100 MG tablet Take 100 mg by mouth daily.   amLODipine (NORVASC) 5 MG tablet Take 10 mg by mouth at bedtime.   atenolol (TENORMIN) 50 MG tablet Take 50 mg by mouth daily.   lisinopril (PRINIVIL,ZESTRIL) 40  MG tablet Take 40 mg by mouth daily.   rosuvastatin (CRESTOR) 5 MG tablet Take 5 mg by mouth daily.   acetaminophen (TYLENOL) 325 MG tablet Take 2 tablets (650 mg total) by mouth every 6 (six) hours as needed.   Ascorbic Acid (VITAMIN C) 1000 MG tablet Take 1,000 mg by mouth daily.   azithromycin (ZITHROMAX) 250 MG tablet Take 250 mg by mouth daily. Started on 10-12-17 DS 5   colchicine 0.6 MG tablet Take 1 tablet (0.6 mg total) by mouth 2 (two) times daily as needed (FOR GOUT ATTACK).   FLUoxetine (PROZAC) 20 MG capsule Take 20 mg by mouth daily.   lisinopril (ZESTRIL) 40 MG tablet Take 1 tablet by mouth daily.   Lysine 500 MG TABS Take 1 tablet by mouth daily.   meloxicam (MOBIC) 15 MG tablet 15 mg daily.   ondansetron (ZOFRAN) 4 MG tablet Take 1 tablet (4 mg total) by mouth every 8 (eight) hours as needed for nausea.   pantoprazole (PROTONIX) 40 MG tablet    pantoprazole (PROTONIX) 40 MG tablet 40 mg daily.   polyethylene glycol (MIRALAX / GLYCOLAX) packet Take 17 g by mouth daily as needed for mild constipation.   simethicone (MYLICON) 80 MG chewable tablet Chew 0.5 tablets (40 mg total) by mouth every 6 (six) hours as needed for flatulence (bloating).   [DISCONTINUED] levothyroxine (SYNTHROID, LEVOTHROID) 100 MCG tablet    [DISCONTINUED] predniSONE (DELTASONE) 10 MG tablet Take 10 mg by mouth daily. Duration 7 days   No facility-administered encounter medications on file as of 01/17/2021.   REVIEW OF SYSTEMS:  Gen: Denies fever, sweats or chills. No weight loss.  CV: Denies chest pain, palpitations or edema. Resp: Denies cough, shortness of breath of hemoptysis.  GI:See HPI.  GU : Denies urinary burning, blood in urine, increased urinary frequency or incontinence. MS: + Arthritis.  Derm: Denies rash, itchiness, skin lesions or unhealing ulcers. Psych: + Depression.  Heme: Denies bruising, bleeding. Neuro:  Denies headaches, dizziness or paresthesias. Endo:  Denies any problems with  DM, thyroid or adrenal function.  PHYSICAL EXAM: Ht 5\' 3"  (1.6 m)    Wt 178 lb (80.7 kg)    BMI 31.53 kg/m  General: 81 year old female in NAD.  Head: Normocephalic and atraumatic. Eyes:  Sclerae non-icteric, conjunctive pink. Ears: Normal auditory acuity. Mouth: Dentition intact. No ulcers or lesions.  Neck: Supple, no lymphadenopathy or thyromegaly.  Lungs: Clear bilaterally to auscultation without wheezes, crackles or rhonchi. Heart: Regular rate and rhythm. Systolic murmur. No rub or gallop appreciated.  Abdomen: Soft, nontender, non distended. No masses. No hepatosplenomegaly. Normoactive bowel sounds x 4 quadrants.  Rectal: Deferred.  Musculoskeletal: Symmetrical with no gross deformities. Skin: Warm and dry. No rash or lesions on visible extremities. Extremities: No edema. Neurological: Alert oriented x 4, no focal deficits.  Psychological:  Alert and cooperative. Normal mood and affect.  ASSESSMENT AND PLAN:  31) 81 year old female with iron deficiency anemia. Intermittent rectal bleeding. No recent GERD symptoms. Recent  labs obtained from PCP reportedly showed IDA for which she received IV iron infusions x 2. Patient reported undergoing 3 colonoscopies in her lifetime, the most recent colonoscopy was 7 years ago.  -Request copy of CBC, CMP and iron studies done Nov - Dec 2022 from PCP [facility]  -CBC, iron panel, TTG, IGA level today -EGD and colonoscopy to rule out upper and lower GI source for IDA, benefits and risks discussed including risk with sedation, risk of bleeding, perforation and infection   2) Past History of GERD, ? food impaction episodes x 1. PPI discontinued 11/2020 by PCP as she was asymptomatic.   3) Altered bowel pattern, soft urgent stools after eating which has progressively worsened over the past year  -Probiotic of choice QD -Lactaid 1 or 2 tabs with each dairy product -Imodium 1/2 tab PRN when eating away from home   Further recommendations  to be determined after the above evaluation completed        CC:  Sueanne Margarita, DO

## 2021-01-17 NOTE — Patient Instructions (Signed)
PROCEDURES: You have been scheduled for an EGD and Colonoscopy. Please follow the written instructions given to you at your visit today. Please pick up your prep supplies at the pharmacy within the next 1-3 days. If you use inhalers (even only as needed), please bring them with you on the day of your procedure.  LABS:  Lab work has been ordered for you today. Our lab is located in the basement. Press "B" on the elevator. The lab is located at the first door on the left as you exit the elevator.  HEALTHCARE LAWS AND MY CHART RESULTS: Due to recent changes in healthcare laws, you may see the results of your imaging and laboratory studies on MyChart before your provider has had a chance to review them.   We understand that in some cases there may be results that are confusing or concerning to you. Not all laboratory results come back in the same time frame and the provider may be waiting for multiple results in order to interpret others.  Please give [place] 48 hours in order for your provider to thoroughly review all the results before contacting the office for clarification of your results.   RECOMMENDATIONS: Take a probiotic of your choice once a day. Lactaid with each dairy product or avoid dairy products. Imodium 1/2 tablet prior to eating away from home.  It was great seeing you today! Thank you for entrusting me with your care and choosing [facility].  Noralyn Pick, CRNP  The [facility] providers would like to encourage you to use [facility] to communicate with providers for non-urgent requests or questions.  Due to long hold times on the telephone, sending your provider a message by [facility] may be faster and more efficient way to get a response. Please allow 48 business hours for a response.  Please remember that this is for non-urgent requests/questions. If you are age 6 or older, your body mass index should be between 23-30. Your Body mass index is 31.53 kg/m. If this  is out of the aforementioned range listed, please consider follow up with your Primary Care Provider.  If you are age 16 or younger, your body mass index should be between 19-25. Your Body mass index is 31.53 kg/m. If this is out of the aformentioned range listed, please consider follow up with your Primary Care Provider.

## 2021-01-20 LAB — IRON,TIBC AND FERRITIN PANEL
%SAT: 37 % (calc) (ref 16–45)
Ferritin: 346 ng/mL — ABNORMAL HIGH (ref 16–288)
Iron: 108 ug/dL (ref 45–160)
TIBC: 294 mcg/dL (calc) (ref 250–450)

## 2021-01-20 LAB — IGA: Immunoglobulin A: 269 mg/dL (ref 70–320)

## 2021-01-20 LAB — TISSUE TRANSGLUTAMINASE ABS,IGG,IGA
(tTG) Ab, IgA: <1 U/mL
(tTG) Ab, IgG: <1 U/mL

## 2021-01-20 NOTE — Progress Notes (Signed)
Agree with the assessment and plan as outlined by Colleen Kennedy-Smith, NP.   Ashyah Quizon, DO, FACG [facility]   

## 2021-01-22 NOTE — Progress Notes (Signed)
Results were received and given to [facility].

## 2021-02-06 ENCOUNTER — Telehealth: Payer: Self-pay | Admitting: Nurse Practitioner

## 2021-02-06 NOTE — Telephone Encounter (Signed)
I called Dr. Sheppard Coil office to request a copy of her past labs which identified IDA. Recent iron levels were normal. Refer office visit 01/17/2021, patient was referred to our office due to having a hx of IDA.  She is scheduled for a and EGD and colonoscopy with Dr. Bryan Lemma on 02/12/2021/

## 2021-02-12 ENCOUNTER — Encounter: Payer: Self-pay | Admitting: Gastroenterology

## 2021-02-12 ENCOUNTER — Other Ambulatory Visit: Payer: Self-pay

## 2021-02-12 ENCOUNTER — Ambulatory Visit (AMBULATORY_SURGERY_CENTER): Payer: Medicare Other | Admitting: Gastroenterology

## 2021-02-12 VITALS — BP 128/63 | HR 67 | Temp 97.8°F | Resp 18 | Ht 63.0 in | Wt 178.0 lb

## 2021-02-12 DIAGNOSIS — K219 Gastro-esophageal reflux disease without esophagitis: Secondary | ICD-10-CM

## 2021-02-12 DIAGNOSIS — K2980 Duodenitis without bleeding: Secondary | ICD-10-CM | POA: Diagnosis not present

## 2021-02-12 DIAGNOSIS — K644 Residual hemorrhoidal skin tags: Secondary | ICD-10-CM

## 2021-02-12 DIAGNOSIS — K2950 Unspecified chronic gastritis without bleeding: Secondary | ICD-10-CM | POA: Diagnosis not present

## 2021-02-12 DIAGNOSIS — K2970 Gastritis, unspecified, without bleeding: Secondary | ICD-10-CM

## 2021-02-12 DIAGNOSIS — D3A026 Benign carcinoid tumor of the rectum: Secondary | ICD-10-CM

## 2021-02-12 DIAGNOSIS — D128 Benign neoplasm of rectum: Secondary | ICD-10-CM

## 2021-02-12 DIAGNOSIS — Q398 Other congenital malformations of esophagus: Secondary | ICD-10-CM

## 2021-02-12 DIAGNOSIS — K5730 Diverticulosis of large intestine without perforation or abscess without bleeding: Secondary | ICD-10-CM | POA: Diagnosis not present

## 2021-02-12 DIAGNOSIS — D509 Iron deficiency anemia, unspecified: Secondary | ICD-10-CM | POA: Diagnosis not present

## 2021-02-12 DIAGNOSIS — K648 Other hemorrhoids: Secondary | ICD-10-CM

## 2021-02-12 DIAGNOSIS — D129 Benign neoplasm of anus and anal canal: Secondary | ICD-10-CM

## 2021-02-12 MED ORDER — SODIUM CHLORIDE 0.9 % IV SOLN: 500.0000 mL | Freq: Once | INTRAVENOUS | Status: DC

## 2021-02-12 NOTE — Progress Notes (Signed)
Called to room to assist during endoscopic procedure.  Patient ID and intended procedure confirmed with present staff. Received instructions for my participation in the procedure from the performing physician.  

## 2021-02-12 NOTE — Progress Notes (Signed)
Report to PACU, RN, vss, BBS= Clear.  

## 2021-02-12 NOTE — Patient Instructions (Addendum)
Handout on polyps, Hemorrhoids,and diverticulosis provided.   Await pathology results.  Use barrier cream (ie, Desitin) on irritated perianal tissue.  Follow-up with Carl Best in the GI office at appointment to be scheduled.  Recheck CBC and iron panel in 2-3 months.  YOU HAD AN ENDOSCOPIC PROCEDURE TODAY AT [facility]:   Refer to the procedure report that was given to you for any specific questions about what was found during the examination.  If the procedure report does not answer your questions, please call your gastroenterologist to clarify.  If you requested that your care partner not be given the details of your procedure findings, then the procedure report has been included in a sealed envelope for you to review at your convenience later.  YOU SHOULD EXPECT: Some feelings of bloating in the abdomen. Passage of more gas than usual.  Walking can help get rid of the air that was put into your GI tract during the procedure and reduce the bloating. If you had a lower endoscopy (such as a colonoscopy or flexible sigmoidoscopy) you may notice spotting of blood in your stool or on the toilet paper. If you underwent a bowel prep for your procedure, you may not have a normal bowel movement for a few days.  Please Note:  You might notice some irritation and congestion in your nose or some drainage.  This is from the oxygen used during your procedure.  There is no need for concern and it should clear up in a day or so.  SYMPTOMS TO REPORT IMMEDIATELY:  Following lower endoscopy (colonoscopy or flexible sigmoidoscopy):  Excessive amounts of blood in the stool  Significant tenderness or worsening of abdominal pains  Swelling of the abdomen that is new, acute  Fever of 100F or higher  Following upper endoscopy (EGD)  Vomiting of blood or coffee ground material  New chest pain or pain under the shoulder blades  Painful or persistently difficult swallowing  New  shortness of breath  Fever of 100F or higher  Black, tarry-looking stools  For urgent or emergent issues, a gastroenterologist can be reached at any hour by calling [phone]. Do not use MyChart messaging for urgent concerns.    DIET:  We do recommend a small meal at first, but then you may proceed to your regular diet.  Drink plenty of fluids but you should avoid alcoholic beverages for 24 hours.  ACTIVITY:  You should plan to take it easy for the rest of today and you should NOT DRIVE or use heavy machinery until tomorrow (because of the sedation medicines used during the test).    FOLLOW UP: Our staff will call the number listed on your records 48-72 hours following your procedure to check on you and address any questions or concerns that you may have regarding the information given to you following your procedure. If we do not reach you, we will leave a message.  We will attempt to reach you two times.  During this call, we will ask if you have developed any symptoms of COVID 19. If you develop any symptoms (ie: fever, flu-like symptoms, shortness of breath, cough etc.) before then, please call [phone].  If you test positive for Covid 19 in the 2 weeks post procedure, please call and report this information to [place].    If any biopsies were taken you will be contacted by phone or by letter within the next 1-3 weeks.  Please call us at [phone] if you  have not heard about the biopsies in 3 weeks.    SIGNATURES/CONFIDENTIALITY: You and/or your care partner have signed paperwork which will be entered into your electronic medical record.  These signatures attest to the fact that that the information above on your After Visit Summary has been reviewed and is understood.  Full responsibility of the confidentiality of this discharge information lies with you and/or your care-partner.

## 2021-02-12 NOTE — Op Note (Signed)
[facility] Patient Name: Joanne Tran Procedure Date: 02/12/2021 1:53 PM MRN: [id number] Endoscopist: Gerrit Heck , MD Age: 81 Referring MD:  Date of Birth: 1940-08-06 Gender: Female Account #: [id number] Procedure:                Upper GI endoscopy Indications:              Iron deficiency anemia, Esophageal reflux                           Good response to IV iron therapy. Medicines:                Monitored Anesthesia Care Procedure:                Pre-Anesthesia Assessment:                           - Prior to the procedure, a History and Physical                            was performed, and patient medications and                            allergies were reviewed. The patient's tolerance of                            previous anesthesia was also reviewed. The risks                            and benefits of the procedure and the sedation                            options and risks were discussed with the patient.                            All questions were answered, and informed consent                            was obtained. Prior Anticoagulants: The patient has                            taken no previous anticoagulant or antiplatelet                            agents. ASA Grade Assessment: II - A patient with                            mild systemic disease. After reviewing the risks                            and benefits, the patient was deemed in                            satisfactory condition to undergo the procedure.  After obtaining informed consent, the endoscope was                            passed under direct vision. Throughout the                            procedure, the patient's blood pressure, pulse, and                            oxygen saturations were monitored continuously. The                            GIF [id number] #[id number] was introduced through the                            mouth, and advanced to the  second part of duodenum.                            The upper GI endoscopy was accomplished without                            difficulty. The patient tolerated the procedure                            well. Scope In: Scope Out: Findings:                 A single area of ectopic gastric mucosa was found                            in the upper third of the esophagus.                           The examined esophagus was normal.                           The Z-line was regular and was found 40 cm from the                            incisors.                           The stomach was J shaped. Normal mucosa was found                            in the entire examined stomach. Biopsies were taken                            with a cold forceps for histology and evaluation of                            Helicobacter pylori. Estimated blood loss was                            minimal.  The examined duodenum was normal. Biopsies for                            histology were taken with a cold forceps for                            evaluation of celiac disease. Estimated blood loss                            was minimal. Complications:            No immediate complications. Estimated Blood Loss:     Estimated blood loss was minimal. Impression:               - Ectopic gastric mucosa in the upper third of the                            esophagus.                           - Normal esophagus.                           - Z-line regular, 40 cm from the incisors.                           - Normal mucosa was found in the entire stomach.                            Biopsied.                           - Normal examined duodenum. Biopsied.                           - No evidence of bleeding or stigmata of recent                            bleeding on this study. Recommendation:           - Patient has a contact number available for                            emergencies. The signs  and symptoms of potential                            delayed complications were discussed with the                            patient. Return to normal activities tomorrow.                            Written discharge instructions were provided to the                            patient.                           -  Resume previous diet.                           - Continue present medications.                           - Await pathology results.                           - Perform a colonoscopy today. Gerrit Heck, MD 02/12/2021 2:28:13 PM

## 2021-02-12 NOTE — Op Note (Signed)
[facility] Patient Name: Joanne Tran Procedure Date: 02/12/2021 1:52 PM MRN: [id number] Endoscopist: Gerrit Heck , MD Age: 81 Referring MD:  Date of Birth: 10-22-1940 Gender: Female Account #: [id number] Procedure:                Colonoscopy Indications:              Hematochezia, Iron deficiency anemia                           Intolerant to PO iron. Good response to IV iron                            infusion x2. Medicines:                Monitored Anesthesia Care Procedure:                Pre-Anesthesia Assessment:                           - Prior to the procedure, a History and Physical                            was performed, and patient medications and                            allergies were reviewed. The patient's tolerance of                            previous anesthesia was also reviewed. The risks                            and benefits of the procedure and the sedation                            options and risks were discussed with the patient.                            All questions were answered, and informed consent                            was obtained. Prior Anticoagulants: The patient has                            taken no previous anticoagulant or antiplatelet                            agents. ASA Grade Assessment: II - A patient with                            mild systemic disease. After reviewing the risks                            and benefits, the patient was deemed in  satisfactory condition to undergo the procedure.                           After obtaining informed consent, the colonoscope                            was passed under direct vision. Throughout the                            procedure, the patient's blood pressure, pulse, and                            oxygen saturations were monitored continuously. The                            CF [id number] #[id number] was introduced through the anus                             and advanced to the the terminal ileum. The                            colonoscopy was performed without difficulty. The                            patient tolerated the procedure well. The quality                            of the bowel preparation was good. The terminal                            ileum, ileocecal valve, appendiceal orifice, and                            rectum were photographed. Scope In: 2:08:23 PM Scope Out: 2:21:59 PM Scope Withdrawal Time: 0 hours 8 minutes 50 seconds  Total Procedure Duration: 0 hours 13 minutes 36 seconds  Findings:                 Hemorrhoids and skin tags were found on perianal                            exam. The perianal skin was also irritated and                            erythematous. No fissures.                           A 2 mm polyp was found in the rectum. The polyp was                            sessile. The polyp was removed with a cold biopsy                            forceps. Resection and retrieval were complete.  Estimated blood loss was minimal.                           Multiple small and large-mouthed diverticula were                            found in the sigmoid colon and ascending colon.                           The mucosa was otherwise normal throughout the                            colon. No areas of mucosal erythema, edema,                            erosions, or ulceration. No active bleeding or                            stigmata of recent bleeding.                           The retroflexed view of the distal rectum and anal                            verge was normal and showed no anal or rectal                            abnormalities.                           The terminal ileum appeared normal. Complications:            No immediate complications. Estimated Blood Loss:     Estimated blood loss was minimal. Impression:               - Hemorrhoids, skin tags, and  irrritated skin was                            found on perianal exam.                           - One 2 mm polyp in the rectum, removed with a cold                            biopsy forceps. Resected and retrieved.                           - Diverticulosis in the sigmoid colon and in the                            ascending colon.                           - Normal mucosa in the entire examined colon.                           -  The distal rectum and anal verge are normal on                            retroflexion view.                           - The examined portion of the ileum was normal. Recommendation:           - Patient has a contact number available for                            emergencies. The signs and symptoms of potential                            delayed complications were discussed with the                            patient. Return to normal activities tomorrow.                            Written discharge instructions were provided to the                            patient.                           - Resume previous diet.                           - Continue present medications.                           - Await pathology results.                           - Repeat colonoscopy for surveillance based on                            pathology results.                           - Use barrier cream (ie, Desitin) on irritated                            perianal tissue.                           - Follow-up with Carl Best in the GI                            office at appointment to be scheduled.                           - Recheck CBC and iron panel in 2-3 months. Gerrit Heck, MD 02/12/2021 2:34:06 PM

## 2021-02-12 NOTE — Progress Notes (Signed)
Pt's states no medical or surgical changes since previsit or office visit. 

## 2021-02-12 NOTE — Progress Notes (Signed)
GASTROENTEROLOGY PROCEDURE H&P NOTE   Primary Care Physician: Sueanne Margarita, DO    Reason for Procedure:  Iron deficiency anemia, GERD, BRBPR  Plan:    EGD, colonoscopy  Patient is appropriate for endoscopic procedure(s) in the ambulatory ([facility]) setting.  The nature of the procedure, as well as the risks, benefits, and alternatives were carefully and thoroughly reviewed with the patient. Ample time for discussion and questions allowed. The patient understood, was satisfied, and agreed to proceed.     HPI: Joanne Tran is a 81 y.o. female who presents for EGD and colonoscopy for evaluation of iron deficiency anemia along with longstanding history of GERD.  Has had a couple episodes of BRBPR..  Patient was most recently seen in the Gastroenterology Clinic on 01/17/2021 by Carl Best.  No interval change in medical history since that appointment. Please refer to that note for full details regarding GI history and clinical presentation.   Past Medical History:  Diagnosis Date   Anemia    Anxiety    uses prozac   Arthritis    "both knees" (08/06/2015)   Cervical spondylosis    Essential hypertension    Gout    Heart murmur    mitral valve prolapse with murmur   Hiatal hernia    History of mitral valve prolapse    Hypertension    Hypothyroidism    Major depression    Spinal headache 1970s   spinal headaches when they removed rectal polyps; "they had done a spinal tap"   Thyroid cancer ([facility]) 03/2007    Past Surgical History:  Procedure Laterality Date   ABDOMINAL HYSTERECTOMY  ~ [place]  03/08/2003   Dr. Terrence Dupont   CHOLECYSTECTOMY N/A 08/07/2015   Procedure: LAPAROSCOPIC CHOLECYSTECTOMY;  Surgeon: Judeth Horn, MD;  Location: [facility];  Service: General;  Laterality: N/A;   DILATION AND CURETTAGE OF UTERUS  multiple   JOINT REPLACEMENT     ORIF ANKLE FRACTURE Left 10/16/2017   Procedure: OPEN REDUCTION INTERNAL  FIXATION (ORIF) ANKLE FRACTURE;  Surgeon: Erle Crocker, MD;  Location: [facility];  Service: Orthopedics;  Laterality: Left;   TONSILLECTOMY  1950s   TOTAL KNEE ARTHROPLASTY Left 06/11/2015   Procedure: LEFT TOTAL KNEE ARTHROPLASTY;  Surgeon: Garald Balding, MD;  Location: [facility];  Service: Orthopedics;  Laterality: Left;   TOTAL THYROIDECTOMY  03/2007   Radioactive iodine therapy Dr. Chalmers Cater    Prior to Admission medications   Medication Sig Start Date End Date Taking? Authorizing Provider  allopurinol (ZYLOPRIM) 100 MG tablet Take 100 mg by mouth daily. 10/12/15  Yes [provider]  amLODipine (NORVASC) 5 MG tablet Take 10 mg by mouth at bedtime.   Yes [provider]  Ascorbic Acid (VITAMIN C) 1000 MG tablet Take 1,000 mg by mouth daily.   Yes [provider]  atenolol (TENORMIN) 50 MG tablet Take 50 mg by mouth daily.   Yes [provider]  FLUoxetine (PROZAC) 20 MG capsule Take 20 mg by mouth daily.   Yes [provider]  levothyroxine (SYNTHROID) 112 MCG tablet Take 112 mcg by mouth daily before breakfast.   Yes [provider]  lisinopril (PRINIVIL,ZESTRIL) 40 MG tablet Take 40 mg by mouth daily.   Yes [provider]  rosuvastatin (CRESTOR) 5 MG tablet Take 5 mg by mouth daily. 11/08/20  Yes [provider]  vitamin B-12 (CYANOCOBALAMIN) 1000 MCG tablet Take 1,000 mcg by  mouth daily.   Yes [provider]    Current Outpatient Medications  Medication Sig Dispense Refill   allopurinol (ZYLOPRIM) 100 MG tablet Take 100 mg by mouth daily.     amLODipine (NORVASC) 5 MG tablet Take 10 mg by mouth at bedtime.     Ascorbic Acid (VITAMIN C) 1000 MG tablet Take 1,000 mg by mouth daily.     atenolol (TENORMIN) 50 MG tablet Take 50 mg by mouth daily.     FLUoxetine (PROZAC) 20 MG capsule Take 20 mg by mouth daily.     levothyroxine (SYNTHROID) 112 MCG tablet Take 112 mcg by mouth daily before breakfast.      lisinopril (PRINIVIL,ZESTRIL) 40 MG tablet Take 40 mg by mouth daily.     rosuvastatin (CRESTOR) 5 MG tablet Take 5 mg by mouth daily.     vitamin B-12 (CYANOCOBALAMIN) 1000 MCG tablet Take 1,000 mcg by mouth daily.     Current Facility-Administered Medications  Medication Dose Route Frequency Provider Last Rate Last Admin   0.9 %  sodium chloride infusion  500 mL Intravenous Once Ahad Colarusso V, DO        Allergies as of 02/12/2021 - Review Complete 02/12/2021  Allergen Reaction Noted   Sulfamethoxazole-trimethoprim  11/08/2020   Codeine Itching    Penicillins Rash 01/04/2015   Sulfa antibiotics Rash 10/16/2017    Family History  Problem Relation Age of Onset   Heart disease Father    Heart attack Father    Heart disease Mother    Heart attack Mother    Ovarian cancer Sister    Heart disease Paternal Uncle        CABG    Social History   Socioeconomic History   Marital status: Widowed    Spouse name: Not on file   Number of children: Not on file   Years of education: Not on file   Highest education level: Not on file  Occupational History   Not on file  Tobacco Use   Smoking status: Never   Smokeless tobacco: Never  Vaping Use   Vaping Use: Never used  Substance and Sexual Activity   Alcohol use: No    Alcohol/week: 0.0 standard drinks   Drug use: No   Sexual activity: Never  Other Topics Concern   Not on file  Social History Narrative   Not on file   Social Determinants of Health   Financial Resource Strain: Not on file  Food Insecurity: Not on file  Transportation Needs: Not on file  Physical Activity: Not on file  Stress: Not on file  Social Connections: Not on file  Intimate Partner Violence: Not on file    Physical Exam: Vital signs in last 24 hours: @BP  (!) 146/61    Pulse (!) 54    Temp 97.8 F (36.6 C) (Skin)    Ht 5\' 3"  (1.6 m)    Wt 178 lb (80.7 kg)    SpO2 96%    BMI 31.53 kg/m  GEN: NAD EYE: Sclerae anicteric ENT: MMM CV:  Non-tachycardic Pulm: CTA b/l GI: Soft, NT/ND NEURO:  Alert & Oriented x [place], DO [facility]   02/12/2021 1:52 PM

## 2021-02-14 ENCOUNTER — Telehealth: Payer: Self-pay

## 2021-02-14 ENCOUNTER — Other Ambulatory Visit: Payer: Self-pay

## 2021-02-14 ENCOUNTER — Telehealth: Payer: Self-pay | Admitting: *Deleted

## 2021-02-14 DIAGNOSIS — K921 Melena: Secondary | ICD-10-CM

## 2021-02-14 DIAGNOSIS — D509 Iron deficiency anemia, unspecified: Secondary | ICD-10-CM

## 2021-02-14 NOTE — Telephone Encounter (Signed)
°  Follow up Call-  Call back number 02/12/2021  Post procedure Call Back phone  # [phone]  Permission to leave phone message Yes  Some recent data might be hidden     Patient questions:  Do you have a fever, pain , or abdominal swelling? Yes.   Pain Score  8 *  Have you tolerated food without any problems? Yes.    Have you been able to return to your normal activities? Yes.    Do you have any questions about your discharge instructions: Diet   No. Medications  No. Follow up visit  No.  Do you have questions or concerns about your Care? No.  Actions: * If pain score is 4 or above: Physician/ provider Notified : Vito Cirigliano, DO.pt c/o 8/10 throat pain when swallows 2 days after EGD ,no fever ,no bleeding

## 2021-02-14 NOTE — Telephone Encounter (Signed)
Procedure report reviewed.  Esophagus was normal-appearing and no esophageal dilation or esophageal biopsies performed.  Unclear etiology, but can send in Rx for viscous lidocaine 2%, drink 5 mL AC & HS x3 days.  If symptoms persist or worsen, or if fever, chills, SOB, CP, inability to tolerate p.o., or other concerns, to call back to the office or go to ER for expedited evaluation.

## 2021-02-14 NOTE — Telephone Encounter (Signed)
Called pt to schedule f/u appt that was requested on colonoscopy report on 02/12/21. Pt is scheduled with Carl Best, NP on 02/28/21 at 2:30 pm. Lab orders from report and reminder in epic. Pt verbalized understanding and had no other concerns at end of call.

## 2021-02-14 NOTE — Telephone Encounter (Signed)
Called patient back and told her that we could give her a prescription of viscous lidocaine but she said not to worry about that, that she was already feeling better from earlier this morning. I did let her know that if it got worse to let us know and she agreed. No further questions.

## 2021-02-28 ENCOUNTER — Ambulatory Visit: Payer: Medicare Other | Admitting: Gastroenterology

## 2021-02-28 ENCOUNTER — Ambulatory Visit: Payer: Medicare Other | Admitting: Nurse Practitioner

## 2021-03-06 ENCOUNTER — Ambulatory Visit: Payer: Medicare Other | Admitting: Nurse Practitioner

## 2021-04-14 ENCOUNTER — Telehealth: Payer: Self-pay

## 2021-04-14 NOTE — Telephone Encounter (Signed)
-----   Message from Marice Potter, RN sent at 02/14/2021  1:12 PM EST ----- ?Regarding: labs ?Pt needs labs, orders in epic.  ? ?

## 2021-04-14 NOTE — Telephone Encounter (Signed)
Called pt to let her know she is due for a repeat CBC and iron panel. Pt stated she is seeing PCP tomorrow for lab work and will get them done at that time.   ?

## 2021-04-17 NOTE — Telephone Encounter (Signed)
Thank you for tracking these down.  Based on these results (H/H 14/40 with MCV/RDW 93/11), I do not think repeat iron panel is needed.   ?

## 2021-04-17 NOTE — Telephone Encounter (Signed)
Here are the CBC results from the office visit with [facility] on 4/11 ? ?CBC   2021-04-15    ?BASO 0.1   0.0 - 0.2  ?BASO% 1.5   0.0 - 2.0  ?EOS 0.2   0.0 - 0.4  ?EOS% 2.5   0.0 - 7.8  ?HCT 40.4   37.0 - 51.0  ?HGB 14.1   12.0 - 18.0  ?LYM 2.3   0.6 - 4.1  ?LYM% 30.9   10.0 - 58.5  ?MCH 32.6   26.0 - 32.0  ?MCHC 35.0   31.0 - 36.0  ?MCV 93.1   80.0 - 97.0  ?MONO 0.1   0.1 - 0.9  ?MONO% 1.9   4.6 - 12.4  ?MPV 6.3   6.9 - 10.6  ?NEU 4.6   1.4 - 7.0  ?NEU% 63.1   43.3 - 71.9  ?PLT 162   140 - 440  ?RBC 4.3   4.2 - 6.3  ?RDW 11.0   12.3 - 15.4  ?WBC 7.36   4.10 - 10.90  ? ?

## 2021-04-17 NOTE — Telephone Encounter (Signed)
If she just had a repeat CBC done 2 days ago, if we can just get a copy of that CBC for review, that may suffice.  If normal, I do not think she needs to come in for separate iron panel. ?

## 2021-04-17 NOTE — Telephone Encounter (Signed)
Left detailed message letting pt know Dr. Vivia Ewing recommendations based on results of CBC.  ?

## 2021-04-17 NOTE — Telephone Encounter (Signed)
Repeat CBC and iron panel was requested on 02/12/21 colonoscopy report. According to Care Everywhere pt went to PCP on 04/15/21 and had the CBC done. Does pt need to come in for repeat iron panel?  ?

## 2021-05-21 ENCOUNTER — Encounter: Payer: Self-pay | Admitting: Orthopaedic Surgery

## 2021-05-21 ENCOUNTER — Ambulatory Visit (INDEPENDENT_AMBULATORY_CARE_PROVIDER_SITE_OTHER): Payer: Medicare Other | Admitting: Orthopaedic Surgery

## 2021-05-21 DIAGNOSIS — M25561 Pain in right knee: Secondary | ICD-10-CM | POA: Diagnosis not present

## 2021-05-21 DIAGNOSIS — G8929 Other chronic pain: Secondary | ICD-10-CM | POA: Diagnosis not present

## 2021-05-21 MED ORDER — METHYLPREDNISOLONE ACETATE 40 MG/ML IJ SUSP
80.0000 mg | INTRAMUSCULAR | Status: AC | PRN
  Administered 2021-05-21: 80 mg via INTRA_ARTICULAR

## 2021-05-21 MED ORDER — BUPIVACAINE HCL 0.25 % IJ SOLN
2.0000 mL | INTRAMUSCULAR | Status: AC | PRN
  Administered 2021-05-21: 2 mL via INTRA_ARTICULAR

## 2021-05-21 NOTE — Progress Notes (Signed)
? ?Office Visit Note ?  ?Patient: Joanne Tran           ?Date of Birth: 1940-03-15           ?MRN: [id number] ?Visit Date: 05/21/2021 ?             ?Requested by: Sueanne Margarita, DO ?[address] ?[place],   [address] ?PCP: Sueanne Margarita, DO ? ?Chief Complaint  ?Patient presents with  ?? Right Knee - Pain  ? ? ? ? ?HPI: ?Patient is a pleasant 81 year old woman who has a known history of osteoarthritis of her right knee.  She received a cortisone injection in December and had good relief.  She is not interested in surgery.  She is requesting another injection today. ? ?Assessment & Plan: ?Visit Diagnoses:  ?1. Chronic pain of right knee   ? ? ?Plan: Right knee arthritis we went forward with an injection today.  She is also interested in viscosupplementation we will put in authorization and hopefully when she returns in 2 weeks we can begin her gel shots ? ?Follow-Up Instructions: Return in about 2 weeks (around 06/04/2021).  ? ?Ortho Exam ? ?Patient is alert, oriented, no adenopathy, well-dressed, normal affect, normal respiratory effort. ?Examination of her right knee no effusion no redness she does have crepitus with range of motion and global pain both medially and laterally ? ?Imaging: ?No results found. ?No images are attached to the encounter. ? ?Labs: ?Lab Results  ?Component Value Date  ? ESRSEDRATE 21 07/24/2015  ? REPTSTATUS 07/25/2015 FINAL 07/24/2015  ? CULT MULTIPLE SPECIES PRESENT, SUGGEST RECOLLECTION (A) 07/24/2015  ? ? ? ?Lab Results  ?Component Value Date  ? ALBUMIN 3.0 (L) 08/07/2015  ? ALBUMIN 3.9 08/06/2015  ? ALBUMIN 3.3 (L) 05/30/2015  ? ? ?No results found for: MG ?Lab Results  ?Component Value Date  ? VD25OH 19.0 11/08/2020  ? ? ?No results found for: PREALBUMIN ? ?  Latest Ref Rng & Units 01/17/2021  ? 12:01 PM 10/16/2017  ?  8:08 AM 08/07/2015  ?  6:33 AM  ?CBC EXTENDED  ?WBC 4.0 - 10.5 K/uL 7.8   11.0   4.9    ?RBC 3.87 - 5.11 Mil/uL 4.85   4.62   4.28    ?Hemoglobin 12.0 - 15.0 g/dL  14.8   12.0   11.5    ?HCT 36.0 - 46.0 % 44.7   39.9   38.0    ?Platelets 150.0 - 400.0 K/uL 281.0   238   236    ?NEUT# 1.7 - 7.7 K/uL  8.6     ?Lymph# 0.7 - 4.0 K/uL  1.8     ? ? ? ?There is no height or weight on file to calculate BMI. ? ?Orders:  ?No orders of the defined types were placed in this encounter. ? ?No orders of the defined types were placed in this encounter. ? ? ? Procedures: ?Large Joint Inj: R knee on 05/21/2021 1:07 PM ?Indications: pain and diagnostic evaluation ?Details: 25 G 1.5 in needle, anteromedial approach ? ?Arthrogram: No ? ?Medications: 80 mg methylPREDNISolone acetate 40 MG/ML ?Outcome: tolerated well, no immediate complications ?Consent was given by the patient. Patient was prepped and draped in the usual sterile fashion.  ? ? ? ?Clinical Data: ?No additional findings. ? ?ROS: ? ?All other systems negative, except as noted in the HPI. ?Review of Systems ? ?Objective: ?Vital Signs: There were no vitals taken for this visit. ? ?Specialty Comments:  ?No specialty comments  available. ? ?PMFS History: ?Patient Active Problem List  ? Diagnosis Date Noted  ?? Vitamin D deficiency 01/09/2021  ?? Iron deficiency anemia 01/09/2021  ?? Osteopenia 01/09/2021  ?? Mitral valve prolapse 01/09/2021  ?? Intertrigo 01/09/2021  ?? Hardening of the aorta (main artery of the heart) ([place]) 01/09/2021  ?? Hyperlipidemia 01/09/2021  ?? Hypertension 01/09/2021  ?? Unilateral primary osteoarthritis, right knee 08/17/2018  ?? Chronic pain of right knee 08/25/2017  ?? Symptomatic cholelithiasis 08/06/2015  ?? Primary osteoarthritis of left knee 06/11/2015  ?? S/P total knee replacement using cement 06/11/2015  ?? Artificial knee joint present 06/11/2015  ?? MITRAL VALVE PROLAPSE 11/16/2007  ?? HIATAL HERNIA 11/16/2007  ?? COUGH 11/16/2007  ?? THYROID CANCER, HX OF 11/16/2007  ? ?Past Medical History:  ?Diagnosis Date  ?? Anemia   ?? Anxiety   ? uses prozac  ?? Arthritis   ? "both knees" (08/06/2015)  ?? Cervical  spondylosis   ?? Essential hypertension   ?? Gout   ?? Heart murmur   ? mitral valve prolapse with murmur  ?? Hiatal hernia   ?? History of mitral valve prolapse   ?? Hypertension   ?? Hypothyroidism   ?? Major depression   ?? Spinal headache 1970s  ? spinal headaches when they removed rectal polyps; "they had done a spinal tap"  ?? Thyroid cancer ([facility]) 03/2007  ?  ?Family History  ?Problem Relation Age of Onset  ?? Heart disease Father   ?? Heart attack Father   ?? Heart disease Mother   ?? Heart attack Mother   ?? Ovarian cancer Sister   ?? Heart disease Paternal Uncle   ?     CABG  ?  ?Past Surgical History:  ?Procedure Laterality Date  ?? ABDOMINAL HYSTERECTOMY  ~ 1975  ?? APPENDECTOMY  1950  ?? CARDIAC CATHETERIZATION  03/08/2003  ? Dr. Terrence Dupont  ?? CHOLECYSTECTOMY N/A 08/07/2015  ? Procedure: LAPAROSCOPIC CHOLECYSTECTOMY;  Surgeon: Judeth Horn, MD;  Location: [facility];  Service: General;  Laterality: N/A;  ?? DILATION AND CURETTAGE OF UTERUS  multiple  ?? JOINT REPLACEMENT    ?? ORIF ANKLE FRACTURE Left 10/16/2017  ? Procedure: OPEN REDUCTION INTERNAL FIXATION (ORIF) ANKLE FRACTURE;  Surgeon: Erle Crocker, MD;  Location: [facility];  Service: Orthopedics;  Laterality: Left;  ?? TONSILLECTOMY  1950s  ?? TOTAL KNEE ARTHROPLASTY Left 06/11/2015  ? Procedure: LEFT TOTAL KNEE ARTHROPLASTY;  Surgeon: Garald Balding, MD;  Location: [facility];  Service: Orthopedics;  Laterality: Left;  ?? TOTAL THYROIDECTOMY  03/2007  ? Radioactive iodine therapy Dr. Chalmers Cater  ? ?Social History  ? ?Occupational History  ?? Not on file  ?Tobacco Use  ?? Smoking status: Never  ?? Smokeless tobacco: Never  ?Vaping Use  ?? Vaping Use: Never used  ?Substance and Sexual Activity  ?? Alcohol use: No  ?  Alcohol/week: 0.0 standard drinks  ?? Drug use: No  ?? Sexual activity: Never  ? ? ? ? ? ?

## 2021-05-21 NOTE — Progress Notes (Signed)
? ?Office Visit Note ?  ?Patient: Joanne Tran           ?Date of Birth: [birth year]           ?MRN: [id number] ?Visit Date: 05/21/2021 ?             ?Requested by: Sueanne Margarita, DO ?[address] ?[place],  [place] [address] ?PCP: Sueanne Margarita, DO ? ? ?Assessment & Plan: ?Visit Diagnoses: No diagnosis found. ? ?Plan: Right knee osteoarthritis went forward with a steroid injection today.  Her last injection was in December and it lasted her quite a while.  She is getting short-term relief than she previously had.  We discussed gel shots and we will go forward and authorize this.  Hopefully she can start her first 1 in 2 weeks ? ?Follow-Up Instructions: No follow-ups on file.  ? ?Orders:  ?No orders of the defined types were placed in this encounter. ? ?No orders of the defined types were placed in this encounter. ? ? ? ? Procedures: ?Large Joint Inj: R knee on 05/21/2021 1:09 PM ?Indications: pain and diagnostic evaluation ?Details: 25 G 1.5 in needle, anteromedial approach ? ?Arthrogram: No ? ?Medications: 2 mL bupivacaine 0.25 % ?Outcome: tolerated well, no immediate complications ? ?After obtaining verbal consent and prepping the knee with alcohol and Betadine patient was injected with 2 cc of bupivacaine and 2 cc of betamethasone she tolerated procedure well ?Procedure, treatment alternatives, risks and benefits explained, specific risks discussed. Consent was given by the patient.  ? ? ? ? ?Clinical Data: ?No additional findings. ? ? ?Subjective: ?Chief Complaint  ?Patient presents with  ? Right Knee - Pain  ?Patient presents today for her right knee. She is requesting a cortisone injection today. Her last cortisone injection was given in December of last year.  ? ?HPI ? ?Review of Systems ? ? ?Objective: ?Vital Signs: There were no vitals taken for this visit. ? ?Physical Exam patient appears well comfortable to exam ?Ortho Exam ?Right knee no effusion no erythema she does have crepitus with range of motion  antalgic gait secondary to pain ?Specialty Comments:  ?No specialty comments available. ? ?Imaging: ?No results found. ? ? ?PMFS History: ?Patient Active Problem List  ? Diagnosis Date Noted  ? Vitamin D deficiency 01/09/2021  ? Iron deficiency anemia 01/09/2021  ? Osteopenia 01/09/2021  ? Mitral valve prolapse 01/09/2021  ? Intertrigo 01/09/2021  ? Hardening of the aorta (main artery of the heart) ([facility]) 01/09/2021  ? Hyperlipidemia 01/09/2021  ? Hypertension 01/09/2021  ? Unilateral primary osteoarthritis, right knee 08/17/2018  ? Chronic pain of right knee 08/25/2017  ? Symptomatic cholelithiasis 08/06/2015  ? Primary osteoarthritis of left knee 06/11/2015  ? S/P total knee replacement using cement 06/11/2015  ? Artificial knee joint present 06/11/2015  ? MITRAL VALVE PROLAPSE 11/16/2007  ? HIATAL HERNIA 11/16/2007  ? COUGH 11/16/2007  ? THYROID CANCER, HX OF 11/16/2007  ? ?Past Medical History:  ?Diagnosis Date  ? Anemia   ? Anxiety   ? uses prozac  ? Arthritis   ? "both knees" (08/06/2015)  ? Cervical spondylosis   ? Essential hypertension   ? Gout   ? Heart murmur   ? mitral valve prolapse with murmur  ? Hiatal hernia   ? History of mitral valve prolapse   ? Hypertension   ? Hypothyroidism   ? Major depression   ? Spinal headache 1970s  ? spinal headaches when they removed rectal polyps; "they had done  a spinal tap"  ? Thyroid cancer ([facility]) 03/2007  ?  ?Family History  ?Problem Relation Age of Onset  ? Heart disease Father   ? Heart attack Father   ? Heart disease Mother   ? Heart attack Mother   ? Ovarian cancer Sister   ? Heart disease Paternal Uncle   ?     CABG  ?  ?Past Surgical History:  ?Procedure Laterality Date  ? ABDOMINAL HYSTERECTOMY  ~ 1975  ? APPENDECTOMY  1950  ? CARDIAC CATHETERIZATION  03/08/2003  ? Dr. Terrence Dupont  ? CHOLECYSTECTOMY N/A 08/07/2015  ? Procedure: LAPAROSCOPIC CHOLECYSTECTOMY;  Surgeon: Judeth Horn, MD;  Location: [facility];  Service: General;  Laterality: N/A;  ? DILATION AND CURETTAGE OF  UTERUS  multiple  ? JOINT REPLACEMENT    ? ORIF ANKLE FRACTURE Left 10/16/2017  ? Procedure: OPEN REDUCTION INTERNAL FIXATION (ORIF) ANKLE FRACTURE;  Surgeon: Erle Crocker, MD;  Location: [facility];  Service: Orthopedics;  Laterality: Left;  ? TONSILLECTOMY  1950s  ? TOTAL KNEE ARTHROPLASTY Left 06/11/2015  ? Procedure: LEFT TOTAL KNEE ARTHROPLASTY;  Surgeon: Garald Balding, MD;  Location: [facility];  Service: Orthopedics;  Laterality: Left;  ? TOTAL THYROIDECTOMY  03/2007  ? Radioactive iodine therapy Dr. Chalmers Cater  ? ?Social History  ? ?Occupational History  ? Not on file  ?Tobacco Use  ? Smoking status: Never  ? Smokeless tobacco: Never  ?Vaping Use  ? Vaping Use: Never used  ?Substance and Sexual Activity  ? Alcohol use: No  ?  Alcohol/week: 0.0 standard drinks  ? Drug use: No  ? Sexual activity: Never  ? ? ? ? ? ? ?

## 2021-06-04 ENCOUNTER — Ambulatory Visit (INDEPENDENT_AMBULATORY_CARE_PROVIDER_SITE_OTHER): Payer: Medicare Other | Admitting: Orthopaedic Surgery

## 2021-06-04 ENCOUNTER — Encounter: Payer: Self-pay | Admitting: Orthopaedic Surgery

## 2021-06-04 DIAGNOSIS — M1711 Unilateral primary osteoarthritis, right knee: Secondary | ICD-10-CM | POA: Diagnosis not present

## 2021-06-04 DIAGNOSIS — G8929 Other chronic pain: Secondary | ICD-10-CM | POA: Diagnosis not present

## 2021-06-04 MED ORDER — HYLAN G-F 20 16 MG/2ML IX SOSY
16.0000 mg | PREFILLED_SYRINGE | INTRA_ARTICULAR | Status: AC | PRN
  Administered 2021-06-04: 16 mg via INTRA_ARTICULAR

## 2021-06-04 NOTE — Progress Notes (Signed)
Office Visit Note   Patient: Joanne Tran           Date of Birth: 08/10/1940           MRN: [id number] Visit Date: 06/04/2021              Requested by: Sueanne Margarita, [address] [place],  [place] [address] PCP: Sueanne Margarita, DO   Assessment & Plan: Visit Diagnoses:  1. Chronic pain of right knee     Plan: Patient presents for her first Synvisc injection into her right knee.  No other complaints.  Knee was injected on the anterior medial aspect.  She will follow-up in 1 week for her second injection.  Prior films have demonstrated advanced osteoarthritis of the right knee  Follow-Up Instructions: 1 week Orders:  No orders of the defined types were placed in this encounter.  No orders of the defined types were placed in this encounter.     Procedures: Large Joint Inj: R knee on 06/04/2021 1:13 PM Indications: pain and diagnostic evaluation Details: 25 G 1.5 in needle, anteromedial approach  Arthrogram: No  Medications: 16 mg Hylan 16 MG/2ML Outcome: tolerated well, no immediate complications Procedure, treatment alternatives, risks and benefits explained, specific risks discussed. Consent was given by the patient.      Clinical Data: No additional findings.   Subjective: Chief Complaint  Patient presents with   Right Knee - Follow-up    Synvisc #1  Patient presents today for the first Synvisc injection into her right knee.  HPI  Review of Systems  All other systems reviewed and are negative.   Objective: Vital Signs: There were no vitals taken for this visit.  Physical Exam Patient sitting comfortably Ortho Exam Examination of her right knee no effusion no redness no cellulitis Specialty Comments:  No specialty comments available.  Imaging: No results found.   PMFS History: Patient Active Problem List   Diagnosis Date Noted   Vitamin D deficiency 01/09/2021   Iron deficiency anemia 01/09/2021   Osteopenia 01/09/2021   Mitral  valve prolapse 01/09/2021   Intertrigo 01/09/2021   Hardening of the aorta (main artery of the heart) ([facility]) 01/09/2021   Hyperlipidemia 01/09/2021   Hypertension 01/09/2021   Unilateral primary osteoarthritis, right knee 08/17/2018   Chronic pain of right knee 08/25/2017   Symptomatic cholelithiasis 08/06/2015   Primary osteoarthritis of left knee 06/11/2015   S/P total knee replacement using cement 06/11/2015   Artificial knee joint present 06/11/2015   MITRAL VALVE PROLAPSE 11/16/2007   HIATAL HERNIA 11/16/2007   COUGH 11/16/2007   THYROID CANCER, HX OF 11/16/2007   Past Medical History:  Diagnosis Date   Anemia    Anxiety    uses prozac   Arthritis    "both knees" (08/06/2015)   Cervical spondylosis    Essential hypertension    Gout    Heart murmur    mitral valve prolapse with murmur   Hiatal hernia    History of mitral valve prolapse    Hypertension    Hypothyroidism    Major depression    Spinal headache 1970s   spinal headaches when they removed rectal polyps; "they had done a spinal tap"   Thyroid cancer ([facility]) 03/2007    Family History  Problem Relation Age of Onset   Heart disease Father    Heart attack Father    Heart disease Mother    Heart attack Mother    Ovarian cancer Sister  Heart disease Paternal Uncle        CABG    Past Surgical History:  Procedure Laterality Date   ABDOMINAL HYSTERECTOMY  ~ [facility]  03/08/2003   Dr. Terrence Dupont   CHOLECYSTECTOMY N/A 08/07/2015   Procedure: LAPAROSCOPIC CHOLECYSTECTOMY;  Surgeon: Judeth Horn, MD;  Location: [facility];  Service: General;  Laterality: N/A;   DILATION AND CURETTAGE OF UTERUS  multiple   JOINT REPLACEMENT     ORIF ANKLE FRACTURE Left 10/16/2017   Procedure: OPEN REDUCTION INTERNAL FIXATION (ORIF) ANKLE FRACTURE;  Surgeon: Erle Crocker, MD;  Location: [facility];  Service: Orthopedics;  Laterality: Left;   TONSILLECTOMY  1950s   TOTAL KNEE ARTHROPLASTY Left  06/11/2015   Procedure: LEFT TOTAL KNEE ARTHROPLASTY;  Surgeon: Garald Balding, MD;  Location: [facility];  Service: Orthopedics;  Laterality: Left;   TOTAL THYROIDECTOMY  03/2007   Radioactive iodine therapy Dr. Chalmers Cater   Social History   Occupational History   Not on file  Tobacco Use   Smoking status: Never   Smokeless tobacco: Never  Vaping Use   Vaping Use: Never used  Substance and Sexual Activity   Alcohol use: No    Alcohol/week: 0.0 standard drinks   Drug use: No   Sexual activity: Never

## 2021-06-05 ENCOUNTER — Encounter: Payer: Self-pay | Admitting: Orthopaedic Surgery

## 2021-06-11 ENCOUNTER — Ambulatory Visit (INDEPENDENT_AMBULATORY_CARE_PROVIDER_SITE_OTHER): Payer: Medicare Other | Admitting: Orthopaedic Surgery

## 2021-06-11 ENCOUNTER — Encounter: Payer: Self-pay | Admitting: Orthopaedic Surgery

## 2021-06-11 DIAGNOSIS — G8929 Other chronic pain: Secondary | ICD-10-CM

## 2021-06-11 DIAGNOSIS — M1711 Unilateral primary osteoarthritis, right knee: Secondary | ICD-10-CM | POA: Diagnosis not present

## 2021-06-11 NOTE — Progress Notes (Signed)
This patient is diagnosed with osteoarthritis of the knee(s).    Radiographs show evidence of joint space narrowing, osteophytes, subchondral sclerosis and/or subchondral cysts.  This patient has knee pain which interferes with functional and activities of daily living.    This patient has experienced inadequate response, adverse effects and/or intolerance with conservative treatments such as acetaminophen, NSAIDS, topical creams, physical therapy or regular exercise, knee bracing and/or weight loss.   This patient has experienced inadequate response or has a contraindication to intra articular steroid injections for at least 3 months.   This patient is not scheduled to have a total knee replacement within 6 months of starting treatment with viscosupplementation.  PROCEDURE NOTE:  Synvisc injection #  2 of 3   Injection 1 vial of Synvisc into right  knee  There were no vitals taken for this visit.  The right knee exam: there was no synovitis or infection   The knee was prepped sterilely  Ethyl chloride was used to anesthetize the skin A 20 g needle was used to inject the knee with 1 vial of Synvisc A sterile dressing was placed  There were no complications  Follow up one week  Call if any problem.  Precautions discussed.  Electronically Signed Sanjuana Kava, MD 6/7/202311:09 AM

## 2021-06-11 NOTE — Progress Notes (Signed)
K

## 2021-06-18 ENCOUNTER — Encounter: Payer: Self-pay | Admitting: Orthopaedic Surgery

## 2021-06-18 ENCOUNTER — Ambulatory Visit (INDEPENDENT_AMBULATORY_CARE_PROVIDER_SITE_OTHER): Payer: Medicare Other | Admitting: Orthopaedic Surgery

## 2021-06-18 DIAGNOSIS — G8929 Other chronic pain: Secondary | ICD-10-CM

## 2021-06-18 DIAGNOSIS — M1711 Unilateral primary osteoarthritis, right knee: Secondary | ICD-10-CM | POA: Diagnosis not present

## 2021-06-18 DIAGNOSIS — M25561 Pain in right knee: Secondary | ICD-10-CM

## 2021-06-18 MED ORDER — HYLAN G-F 20 16 MG/2ML IX SOSY
16.0000 mg | PREFILLED_SYRINGE | INTRA_ARTICULAR | Status: AC | PRN
  Administered 2021-06-18: 16 mg via INTRA_ARTICULAR

## 2021-06-18 NOTE — Progress Notes (Signed)
Office Visit Note   Patient: Joanne Tran           Date of Birth: 04-Aug-1940           MRN: [id number] Visit Date: 06/18/2021              Requested by: Sueanne Margarita, [address] [place],  [place] [address] PCP: Sueanne Margarita, DO  Right knee pain    HPI: Patient comes in today for her third and final injection of Synvisc into her right knee.  She thinks she is starting to get some good relief.  Assessment & Plan: Visit Diagnoses:  1. Chronic pain of right knee     Plan: Patient was given her final injection and tolerated this well may follow-up as needed she may get another cortisone injection in 2 months if she would like  Follow-Up Instructions: Return if symptoms worsen or fail to improve.   Ortho Exam  Patient is alert, oriented, no adenopathy, well-dressed, normal affect, normal respiratory effort. Examination of the right knee no effusion no redness no significant swelling.  Imaging: No results found. No images are attached to the encounter.  Labs: Lab Results  Component Value Date   ESRSEDRATE 21 07/24/2015   REPTSTATUS 07/25/2015 FINAL 07/24/2015   CULT MULTIPLE SPECIES PRESENT, SUGGEST RECOLLECTION (A) 07/24/2015     Lab Results  Component Value Date   ALBUMIN 3.0 (L) 08/07/2015   ALBUMIN 3.9 08/06/2015   ALBUMIN 3.3 (L) 05/30/2015    No results found for: "MG" Lab Results  Component Value Date   VD25OH 19.0 11/08/2020    No results found for: "PREALBUMIN"    Latest Ref Rng & Units 01/17/2021   12:01 PM 10/16/2017    8:08 AM 08/07/2015    6:33 AM  CBC EXTENDED  WBC 4.0 - 10.5 K/uL 7.8  11.0  4.9   RBC 3.87 - 5.11 Mil/uL 4.85  4.62  4.28   Hemoglobin 12.0 - 15.0 g/dL 14.8  12.0  11.5   HCT 36.0 - 46.0 % 44.7  39.9  38.0   Platelets 150.0 - 400.0 K/uL 281.0  238  236   NEUT# 1.7 - 7.7 K/uL  8.6    Lymph# 0.7 - 4.0 K/uL  1.8       There is no height or weight on file to calculate BMI.  Orders:  No orders of the defined types  were placed in this encounter.  No orders of the defined types were placed in this encounter.    Procedures: Large Joint Inj: R knee on 06/18/2021 1:23 PM Indications: pain and diagnostic evaluation Details: 25 G 1.5 in needle, anteromedial approach  Arthrogram: No  Medications: 16 mg Hylan 16 MG/2ML Outcome: tolerated well, no immediate complications Procedure, treatment alternatives, risks and benefits explained, specific risks discussed. Consent was given by the patient.     Clinical Data: No additional findings.  ROS:  All other systems negative, except as noted in the HPI. Review of Systems  Objective: Vital Signs: There were no vitals taken for this visit.  Specialty Comments:  No specialty comments available.  PMFS History: Patient Active Problem List   Diagnosis Date Noted  . Vitamin D deficiency 01/09/2021  . Iron deficiency anemia 01/09/2021  . Osteopenia 01/09/2021  . Mitral valve prolapse 01/09/2021  . Intertrigo 01/09/2021  . Hardening of the aorta (main artery of the heart) ([facility]) 01/09/2021  . Hyperlipidemia 01/09/2021  . Hypertension 01/09/2021  . Unilateral primary osteoarthritis,  right knee 08/17/2018  . Chronic pain of right knee 08/25/2017  . Symptomatic cholelithiasis 08/06/2015  . Primary osteoarthritis of left knee 06/11/2015  . S/P total knee replacement using cement 06/11/2015  . Artificial knee joint present 06/11/2015  . MITRAL VALVE PROLAPSE 11/16/2007  . HIATAL HERNIA 11/16/2007  . COUGH 11/16/2007  . THYROID CANCER, HX OF 11/16/2007   Past Medical History:  Diagnosis Date  . Anemia   . Anxiety    uses prozac  . Arthritis    "both knees" (08/06/2015)  . Cervical spondylosis   . Essential hypertension   . Gout   . Heart murmur    mitral valve prolapse with murmur  . Hiatal hernia   . History of mitral valve prolapse   . Hypertension   . Hypothyroidism   . Major depression   . Spinal headache 1970s   spinal headaches when  they removed rectal polyps; "they had done a spinal tap"  . Thyroid cancer ([facility]) 03/2007    Family History  Problem Relation Age of Onset  . Heart disease Father   . Heart attack Father   . Heart disease Mother   . Heart attack Mother   . Ovarian cancer Sister   . Heart disease Paternal Uncle        CABG    Past Surgical History:  Procedure Laterality Date  . ABDOMINAL HYSTERECTOMY  ~ 1975  . APPENDECTOMY  1950  . CARDIAC CATHETERIZATION  03/08/2003   Dr. Terrence Dupont  . CHOLECYSTECTOMY N/A 08/07/2015   Procedure: LAPAROSCOPIC CHOLECYSTECTOMY;  Surgeon: Judeth Horn, MD;  Location: [facility];  Service: General;  Laterality: N/A;  . DILATION AND CURETTAGE OF UTERUS  multiple  . JOINT REPLACEMENT    . ORIF ANKLE FRACTURE Left 10/16/2017   Procedure: OPEN REDUCTION INTERNAL FIXATION (ORIF) ANKLE FRACTURE;  Surgeon: Erle Crocker, MD;  Location: [facility];  Service: Orthopedics;  Laterality: Left;  . TONSILLECTOMY  1950s  . TOTAL KNEE ARTHROPLASTY Left 06/11/2015   Procedure: LEFT TOTAL KNEE ARTHROPLASTY;  Surgeon: Garald Balding, MD;  Location: [facility];  Service: Orthopedics;  Laterality: Left;  . TOTAL THYROIDECTOMY  03/2007   Radioactive iodine therapy Dr. Chalmers Cater   Social History   Occupational History  . Not on file  Tobacco Use  . Smoking status: Never  . Smokeless tobacco: Never  Vaping Use  . Vaping Use: Never used  Substance and Sexual Activity  . Alcohol use: No    Alcohol/week: 0.0 standard drinks of alcohol  . Drug use: No  . Sexual activity: Never

## 2021-10-07 ENCOUNTER — Telehealth: Payer: Self-pay | Admitting: Gastroenterology

## 2021-10-07 NOTE — Telephone Encounter (Signed)
Inbound call from [place] at [facility] requesting a call back regarded patients colonoscopy on 02/12/21. [phone]

## 2021-10-07 NOTE — Telephone Encounter (Signed)
Called number below and switchboard is closed.

## 2021-10-08 ENCOUNTER — Other Ambulatory Visit: Payer: Self-pay | Admitting: Obstetrics and Gynecology

## 2021-10-08 DIAGNOSIS — Z8249 Family history of ischemic heart disease and other diseases of the circulatory system: Secondary | ICD-10-CM

## 2021-10-08 NOTE — Telephone Encounter (Signed)
Left voicemail for Alyse Low to return call.

## 2021-10-09 NOTE — Telephone Encounter (Signed)
Spoke with Alyse Low at [facility] office who stated that pt had a colonoscopy in February and wanted to know if there had been any follow up from the neuroendocrine tumor polyp that was found on colonoscopy report. Read Christy Dr. Vivia Ewing surgical pathology result note stating that pt did not want imaging, flex sig, or surgical oncology referral. Pt was scheduled for follow up in February which was canceled and rescheduled to 03/06/21. Patient canceled 3/2 appointment and the reason listed is that pt stated it was no longer needed. Alyse Low wanted to make sure that pt did not slip through cracks on follow up. Christy provided her number in case Dr. Bryan Lemma wanted to communicate with Dr. Radene Knee. Christy's number is [phone] ext [phone].  Do I need to contact pt to schedule follow up?

## 2021-10-09 NOTE — Telephone Encounter (Signed)
Chart reviewed.  As you pointed out, I offered her additional imaging, expedited flexible sigmoidoscopy, and even consideration for referral to the surgical clinic.  Generally speaking, these subcentimeter neuroendocrine tumors should be confined to the mucosa/submucosa, and good chance it was completely resected at time of colonoscopy.  But cannot be 396% certain, and that is why I offered all of those other modalities.  Can certainly offer her a follow-up appointment with me so we can review all of that in detail and see if she would like to proceed with any 1 (or combination) of those studies.  I will be happy to see her again and discuss.  Thanks.

## 2021-10-10 NOTE — Telephone Encounter (Addendum)
Spoke with pt to get her scheduled for a follow up appointment. Pt initially declined because she stated felt fine and didn't think she needed anything further. Reiterated Dr. Vivia Ewing message that although there is a good chance it was completely resected at the time of colonoscopy, cannot be 947% certain. Pt stated she would think about Dr. Vivia Ewing message and call back if she wants to schedule an appointment. Spoke with Alyse Low to let her know pt declined to schedule appointment today and wanted to think about it and would call back if she wants an appointment.

## 2021-10-29 ENCOUNTER — Other Ambulatory Visit (HOSPITAL_COMMUNITY): Payer: Self-pay | Admitting: Internal Medicine

## 2021-10-29 ENCOUNTER — Other Ambulatory Visit: Payer: Medicare Other

## 2021-10-29 DIAGNOSIS — R55 Syncope and collapse: Secondary | ICD-10-CM

## 2021-11-10 ENCOUNTER — Ambulatory Visit (HOSPITAL_COMMUNITY): Payer: Medicare Other | Attending: Internal Medicine

## 2021-11-10 DIAGNOSIS — R55 Syncope and collapse: Secondary | ICD-10-CM | POA: Diagnosis present

## 2021-11-10 LAB — ECHOCARDIOGRAM COMPLETE
Area-P 1/2: 3.27 cm2
S' Lateral: 1.7 cm

## 2021-12-05 ENCOUNTER — Ambulatory Visit: Payer: Medicare Other | Admitting: Gastroenterology

## 2022-01-28 ENCOUNTER — Other Ambulatory Visit: Payer: Self-pay | Admitting: Physician Assistant

## 2022-01-28 DIAGNOSIS — M1711 Unilateral primary osteoarthritis, right knee: Secondary | ICD-10-CM

## 2022-03-05 ENCOUNTER — Encounter: Payer: Self-pay | Admitting: Radiology

## 2022-07-02 ENCOUNTER — Encounter: Payer: Self-pay | Admitting: Orthopaedic Surgery

## 2022-07-02 ENCOUNTER — Ambulatory Visit (INDEPENDENT_AMBULATORY_CARE_PROVIDER_SITE_OTHER): Payer: Medicare Other | Admitting: Orthopaedic Surgery

## 2022-07-02 VITALS — Ht 63.0 in | Wt 180.0 lb

## 2022-07-02 DIAGNOSIS — M6281 Muscle weakness (generalized): Secondary | ICD-10-CM

## 2022-07-02 DIAGNOSIS — Z96652 Presence of left artificial knee joint: Secondary | ICD-10-CM

## 2022-07-02 DIAGNOSIS — M1711 Unilateral primary osteoarthritis, right knee: Secondary | ICD-10-CM

## 2022-07-02 MED ORDER — BUPIVACAINE HCL 0.25 % IJ SOLN
4.0000 mL | INTRAMUSCULAR | Status: AC | PRN
Start: 2022-07-02 — End: 2022-07-02
  Administered 2022-07-02: 4 mL via INTRA_ARTICULAR

## 2022-07-02 MED ORDER — LIDOCAINE HCL 1 % IJ SOLN
0.5000 mL | INTRAMUSCULAR | Status: AC | PRN
Start: 2022-07-02 — End: 2022-07-02
  Administered 2022-07-02: .5 mL

## 2022-07-02 MED ORDER — METHYLPREDNISOLONE ACETATE 40 MG/ML IJ SUSP
40.0000 mg | INTRAMUSCULAR | Status: AC | PRN
Start: 2022-07-02 — End: 2022-07-02
  Administered 2022-07-02: 40 mg via INTRA_ARTICULAR

## 2022-07-02 NOTE — Progress Notes (Signed)
Office Visit Note   Patient: Joanne Tran           Date of Birth: 01/25/1940           MRN: [id number] Visit Date: 07/02/2022              Requested by: Charlane Ferretti, DO [address] [place],  [place] [address] PCP: Charlane Ferretti, DO   Assessment & Plan: Visit Diagnoses:  1. Weakness of left quadriceps muscle   2. Unilateral primary osteoarthritis, right knee   3. Hx of total knee arthroplasty, left     Plan: Right knee injection performed which she tolerated well.  She will work on left quadriceps with a book bag over her ankle with straight leg raising.  She is not strong enough to do a single stance toe touch off of a step or curb at this point.  Recheck 3 months if she is not doing well we can order some formal physical therapy for her left quad.  Follow-Up Instructions: No follow-ups on file.   Orders:  No orders of the defined types were placed in this encounter.  No orders of the defined types were placed in this encounter.     Procedures: Large Joint Inj: R knee on 07/02/2022 2:21 PM Indications: pain and joint swelling Details: 22 G 1.5 in needle, anterolateral approach  Arthrogram: No  Medications: 40 mg methylPREDNISolone acetate 40 MG/ML; 0.5 mL lidocaine 1 %; 4 mL bupivacaine 0.25 % Outcome: tolerated well, no immediate complications Procedure, treatment alternatives, risks and benefits explained, specific risks discussed. Consent was given by the patient. Immediately prior to procedure a time out was called to verify the correct patient, procedure, equipment, support staff and site/side marked as required. Patient was prepped and draped in the usual sterile fashion.       Clinical Data: No additional findings.   Subjective: Chief Complaint  Patient presents with   Right Knee - Pain    HPI 82 year old female previous left TKA 2017 by Dr. Cleophas Dunker.  She has progressive right knee osteoarthritis recent Synvisc injections completed 06/18/2021.   Increasing problems with pain and swelling in her knee she is requesting a right knee cortisone injection.  She has problems with left knee in addition with discomfort.  Previous ankle fracture treated by Dr.Adair.   Review of Systems updated unchanged   Objective: Vital Signs: Ht 5\' 3"  (1.6 m)   Wt 180 lb (81.6 kg)   BMI 31.89 kg/m   Physical Exam Constitutional:      Appearance: She is well-developed.  HENT:     Head: Normocephalic.     Right Ear: External ear normal.     Left Ear: External ear normal. There is no impacted cerumen.  Eyes:     Pupils: Pupils are equal, round, and reactive to light.  Neck:     Thyroid: No thyromegaly.     Trachea: No tracheal deviation.  Cardiovascular:     Rate and Rhythm: Normal rate.  Pulmonary:     Effort: Pulmonary effort is normal.  Abdominal:     Palpations: Abdomen is soft.  Musculoskeletal:     Cervical back: No rigidity.  Skin:    General: Skin is warm and dry.  Neurological:     Mental Status: She is alert and oriented to person, place, and time.  Psychiatric:        Behavior: Behavior normal.     Ortho Exam patient uses a hop step to go  left foot up first as well as right foot with bilateral quad weakness.  Collateral ligaments are stable for left total knee arthroplasty.  Crepitus with right knee range of motion.  Pulses are palpable negative logroll hips right and left.  Specialty Comments:  No specialty comments available.  Imaging: No results found.   PMFS History: Patient Active Problem List   Diagnosis Date Noted   Weakness of left quadriceps muscle 07/02/2022   Hx of total knee arthroplasty, left 07/02/2022   Vitamin D deficiency 01/09/2021   Iron deficiency anemia 01/09/2021   Osteopenia 01/09/2021   Mitral valve prolapse 01/09/2021   Intertrigo 01/09/2021   Hardening of the aorta (main artery of the heart) (HCC) 01/09/2021   Hyperlipidemia 01/09/2021   Hypertension 01/09/2021   Unilateral primary  osteoarthritis, right knee 08/17/2018   Chronic pain of right knee 08/25/2017   Symptomatic cholelithiasis 08/06/2015   Artificial knee joint present 06/11/2015   MITRAL VALVE PROLAPSE 11/16/2007   HIATAL HERNIA 11/16/2007   COUGH 11/16/2007   THYROID CANCER, HX OF 11/16/2007   Past Medical History:  Diagnosis Date   Anemia    Anxiety    uses prozac   Arthritis    "both knees" (08/06/2015)   Cervical spondylosis    Essential hypertension    Gout    Heart murmur    mitral valve prolapse with murmur   Hiatal hernia    History of mitral valve prolapse    Hypertension    Hypothyroidism    Major depression    Spinal headache 1970s   spinal headaches when they removed rectal polyps; "they had done a spinal tap"   Thyroid cancer (HCC) 03/2007    Family History  Problem Relation Age of Onset   Heart disease Father    Heart attack Father    Heart disease Mother    Heart attack Mother    Ovarian cancer Sister    Heart disease Paternal Uncle        CABG    Past Surgical History:  Procedure Laterality Date   ABDOMINAL HYSTERECTOMY  ~ 1975   APPENDECTOMY  1950   CARDIAC CATHETERIZATION  03/08/2003   Dr. Sharyn Lull   CHOLECYSTECTOMY N/A 08/07/2015   Procedure: LAPAROSCOPIC CHOLECYSTECTOMY;  Surgeon: Jimmye Norman, MD;  Location: [facility] OR;  Service: General;  Laterality: N/A;   DILATION AND CURETTAGE OF UTERUS  multiple   JOINT REPLACEMENT     ORIF ANKLE FRACTURE Left 10/16/2017   Procedure: OPEN REDUCTION INTERNAL FIXATION (ORIF) ANKLE FRACTURE;  Surgeon: Terance Hart, MD;  Location: [facility] OR;  Service: Orthopedics;  Laterality: Left;   TONSILLECTOMY  1950s   TOTAL KNEE ARTHROPLASTY Left 06/11/2015   Procedure: LEFT TOTAL KNEE ARTHROPLASTY;  Surgeon: Valeria Batman, MD;  Location: [facility] OR;  Service: Orthopedics;  Laterality: Left;   TOTAL THYROIDECTOMY  03/2007   Radioactive iodine therapy Dr. Talmage Nap   Social History   Occupational History   Not on file  Tobacco Use   Smoking  status: Never   Smokeless tobacco: Never  Vaping Use   Vaping Use: Never used  Substance and Sexual Activity   Alcohol use: No    Alcohol/week: 0.0 standard drinks of alcohol   Drug use: No   Sexual activity: Never

## 2022-10-01 ENCOUNTER — Ambulatory Visit: Payer: Medicare Other | Admitting: Orthopaedic Surgery

## 2022-10-08 ENCOUNTER — Encounter: Payer: Self-pay | Admitting: Orthopaedic Surgery

## 2022-10-08 ENCOUNTER — Ambulatory Visit (INDEPENDENT_AMBULATORY_CARE_PROVIDER_SITE_OTHER): Payer: Medicare Other | Admitting: Orthopaedic Surgery

## 2022-10-08 DIAGNOSIS — M1711 Unilateral primary osteoarthritis, right knee: Secondary | ICD-10-CM

## 2022-10-08 MED ORDER — BUPIVACAINE HCL 0.25 % IJ SOLN
4.0000 mL | INTRAMUSCULAR | Status: AC | PRN
Start: 2022-10-08 — End: 2022-10-08
  Administered 2022-10-08: 4 mL via INTRA_ARTICULAR

## 2022-10-08 MED ORDER — METHYLPREDNISOLONE ACETATE 40 MG/ML IJ SUSP
40.0000 mg | INTRAMUSCULAR | Status: AC | PRN
Start: 2022-10-08 — End: 2022-10-08
  Administered 2022-10-08: 40 mg via INTRA_ARTICULAR

## 2022-10-08 MED ORDER — LIDOCAINE HCL 1 % IJ SOLN
0.5000 mL | INTRAMUSCULAR | Status: AC | PRN
Start: 2022-10-08 — End: 2022-10-08
  Administered 2022-10-08: .5 mL

## 2022-10-08 NOTE — Progress Notes (Signed)
Office Visit Note   Patient: Joanne Tran           Date of Birth: 10-25-1940           MRN: [id number] Visit Date: 10/08/2022              Requested by: Charlane Ferretti, DO [address] [place],  [place] [address] PCP: Charlane Ferretti, DO   Assessment & Plan: Visit Diagnoses:  1. Unilateral primary osteoarthritis, right knee     Plan: Right knee injection performed recheck 2 months if she is having ongoing problems we will shoot standing AP both knees and lateral right and left knee on return  Follow-Up Instructions: No follow-ups on file.   Orders:  No orders of the defined types were placed in this encounter.  No orders of the defined types were placed in this encounter.     Procedures: Large Joint Inj: R knee on 10/08/2022 2:47 PM Indications: pain and joint swelling Details: 22 G 1.5 in needle, anterolateral approach  Arthrogram: No  Medications: 40 mg methylPREDNISolone acetate 40 MG/ML; 0.5 mL lidocaine 1 %; 4 mL bupivacaine 0.25 % Outcome: tolerated well, no immediate complications Procedure, treatment alternatives, risks and benefits explained, specific risks discussed. Consent was given by the patient. Immediately prior to procedure a time out was called to verify the correct patient, procedure, equipment, support staff and site/side marked as required. Patient was prepped and draped in the usual sterile fashion.       Clinical Data: No additional findings.   Subjective: Chief Complaint  Patient presents with   Right Knee - Pain, Follow-up    Pain back for 1 mo, would like an injections but states last injections aren't lasting as long as usual    HPI 82 year old female seen with progressive right knee osteoarthritis.  She had previous left total knee arthroplasty 7 years ago with Dr. Cleophas Dunker and states her left knee bothers her some.  She does mostly sitting her family encourages her to walk.  She has problems getting from sitting to standing when  she is upright she is moving better.  Patient states she does not want to think about any more surgery at her age.  She is requesting a right knee injection.  Review of Systems updated unchanged   Objective: Vital Signs: There were no vitals taken for this visit.  Physical Exam Constitutional:      Appearance: She is well-developed.  HENT:     Head: Normocephalic.     Right Ear: External ear normal.     Left Ear: External ear normal. There is no impacted cerumen.  Eyes:     Pupils: Pupils are equal, round, and reactive to light.  Neck:     Thyroid: No thyromegaly.     Trachea: No tracheal deviation.  Cardiovascular:     Rate and Rhythm: Normal rate.  Pulmonary:     Effort: Pulmonary effort is normal.  Abdominal:     Palpations: Abdomen is soft.  Musculoskeletal:     Cervical back: No rigidity.  Skin:    General: Skin is warm and dry.  Neurological:     Mental Status: She is alert and oriented to person, place, and time.  Psychiatric:        Behavior: Behavior normal.     Ortho Exam right knee 2+ effusion negative logroll hips right and left.  Well-healed midline incision left leg with balance collateral ligaments.  Specialty Comments:  No specialty comments available.  Imaging: No results found.   PMFS History: Patient Active Problem List   Diagnosis Date Noted   Weakness of left quadriceps muscle 07/02/2022   Hx of total knee arthroplasty, left 07/02/2022   Vitamin D deficiency 01/09/2021   Iron deficiency anemia 01/09/2021   Osteopenia 01/09/2021   Mitral valve prolapse 01/09/2021   Intertrigo 01/09/2021   Hardening of the aorta (main artery of the heart) (HCC) 01/09/2021   Hyperlipidemia 01/09/2021   Hypertension 01/09/2021   Unilateral primary osteoarthritis, right knee 08/17/2018   Chronic pain of right knee 08/25/2017   Symptomatic cholelithiasis 08/06/2015   Artificial knee joint present 06/11/2015   MITRAL VALVE PROLAPSE 11/16/2007   HIATAL  HERNIA 11/16/2007   COUGH 11/16/2007   THYROID CANCER, HX OF 11/16/2007   Past Medical History:  Diagnosis Date   Anemia    Anxiety    uses prozac   Arthritis    "both knees" (08/06/2015)   Cervical spondylosis    Essential hypertension    Gout    Heart murmur    mitral valve prolapse with murmur   Hiatal hernia    History of mitral valve prolapse    Hypertension    Hypothyroidism    Major depression    Spinal headache 1970s   spinal headaches when they removed rectal polyps; "they had done a spinal tap"   Thyroid cancer (HCC) 03/2007    Family History  Problem Relation Age of Onset   Heart disease Father    Heart attack Father    Heart disease Mother    Heart attack Mother    Ovarian cancer Sister    Heart disease Paternal Uncle        CABG    Past Surgical History:  Procedure Laterality Date   ABDOMINAL HYSTERECTOMY  ~ 1975   APPENDECTOMY  1950   CARDIAC CATHETERIZATION  03/08/2003   Dr. Sharyn Lull   CHOLECYSTECTOMY N/A 08/07/2015   Procedure: LAPAROSCOPIC CHOLECYSTECTOMY;  Surgeon: Jimmye Norman, MD;  Location: [facility] OR;  Service: General;  Laterality: N/A;   DILATION AND CURETTAGE OF UTERUS  multiple   JOINT REPLACEMENT     ORIF ANKLE FRACTURE Left 10/16/2017   Procedure: OPEN REDUCTION INTERNAL FIXATION (ORIF) ANKLE FRACTURE;  Surgeon: Terance Hart, MD;  Location: [facility] OR;  Service: Orthopedics;  Laterality: Left;   TONSILLECTOMY  1950s   TOTAL KNEE ARTHROPLASTY Left 06/11/2015   Procedure: LEFT TOTAL KNEE ARTHROPLASTY;  Surgeon: Valeria Batman, MD;  Location: [facility] OR;  Service: Orthopedics;  Laterality: Left;   TOTAL THYROIDECTOMY  03/2007   Radioactive iodine therapy Dr. Talmage Nap   Social History   Occupational History   Not on file  Tobacco Use   Smoking status: Never   Smokeless tobacco: Never  Vaping Use   Vaping status: Never Used  Substance and Sexual Activity   Alcohol use: No    Alcohol/week: 0.0 standard drinks of alcohol   Drug use: No   Sexual  activity: Never

## 2022-11-23 ENCOUNTER — Other Ambulatory Visit: Payer: Self-pay | Admitting: Obstetrics and Gynecology

## 2022-11-23 DIAGNOSIS — R928 Other abnormal and inconclusive findings on diagnostic imaging of breast: Secondary | ICD-10-CM

## 2022-12-10 ENCOUNTER — Ambulatory Visit: Payer: Medicare Other | Admitting: Orthopaedic Surgery

## 2022-12-10 ENCOUNTER — Other Ambulatory Visit: Payer: Self-pay

## 2022-12-10 ENCOUNTER — Encounter: Payer: Self-pay | Admitting: Orthopaedic Surgery

## 2022-12-10 VITALS — Ht 63.0 in | Wt 180.0 lb

## 2022-12-10 DIAGNOSIS — M1711 Unilateral primary osteoarthritis, right knee: Secondary | ICD-10-CM | POA: Diagnosis not present

## 2022-12-10 DIAGNOSIS — Z96652 Presence of left artificial knee joint: Secondary | ICD-10-CM | POA: Diagnosis not present

## 2022-12-10 DIAGNOSIS — M25561 Pain in right knee: Secondary | ICD-10-CM | POA: Diagnosis not present

## 2022-12-10 DIAGNOSIS — M25562 Pain in left knee: Secondary | ICD-10-CM

## 2022-12-10 DIAGNOSIS — G8929 Other chronic pain: Secondary | ICD-10-CM

## 2022-12-10 MED ORDER — NAPROXEN 500 MG PO TABS: 500.0000 mg | ORAL_TABLET | Freq: Two times a day (BID) | ORAL | 3 refills | Status: AC

## 2022-12-10 NOTE — Progress Notes (Signed)
Office Visit Note   Patient: Joanne Tran           Date of Birth: 06/25/1940           MRN: [id number] Visit Date: 12/10/2022              Requested by: Charlane Ferretti, DO [address] [place],  [place] [address] PCP: Charlane Ferretti, DO   Assessment & Plan: Visit Diagnoses:  1. Bilateral chronic knee pain     Plan: Discussed with her that right knee at this point may not look severe enough to consider total knee arthroplasty.  Will send in some Naprosyn if she is having ongoing problems and wants an injection she can return.  Follow-Up Instructions: No follow-ups on file.   Orders:  Orders Placed This Encounter  Procedures   XR Knee 1-2 Views Right   XR Knee 1-2 Views Left   No orders of the defined types were placed in this encounter.     Procedures: No procedures performed   Clinical Data: No additional findings.   Subjective: Chief Complaint  Patient presents with   Right Knee - Pain   Left Knee - Pain    HPI 82 year old female had previous left total knee arthroplasty done elsewhere which is still working well.  She had an injection in her right knee for knee arthritis with only short-term improvement.  She tried some CBD cream states that helped she states she does not want to have surgery on her knee.  X-rays of her knee obtained today showed medial joint line narrowing small osteophytes consistent with mild to moderate right knee arthritic osteoarthritis.  She is not on anti-inflammatory currently and states she request a tier 1 drug for her insurance.   Review of Systems all other systems updated unchanged   Objective: Vital Signs: Ht 5\' 3"  (1.6 m)   Wt 180 lb (81.6 kg)   BMI 31.89 kg/m   Physical Exam Constitutional:      Appearance: She is well-developed.  HENT:     Head: Normocephalic.     Right Ear: External ear normal.     Left Ear: External ear normal. There is no impacted cerumen.  Eyes:     Pupils: Pupils are equal, round, and  reactive to light.  Neck:     Thyroid: No thyromegaly.     Trachea: No tracheal deviation.  Cardiovascular:     Rate and Rhythm: Normal rate.  Pulmonary:     Effort: Pulmonary effort is normal.  Abdominal:     Palpations: Abdomen is soft.  Musculoskeletal:     Cervical back: No rigidity.  Skin:    General: Skin is warm and dry.  Neurological:     Mental Status: She is alert and oriented to person, place, and time.  Psychiatric:        Behavior: Behavior normal.     Ortho Exam healed left anterior midline knee incision.  No knee swelling left knee mild crepitus right knee good range of motion full extension.  Ambulation with short stride shuffle type gait without significant right knee limp.  Negative logroll hips.  Specialty Comments:  No specialty comments available.  Imaging: No results found.   PMFS History: Patient Active Problem List   Diagnosis Date Noted   Weakness of left quadriceps muscle 07/02/2022   Hx of total knee arthroplasty, left 07/02/2022   Vitamin D deficiency 01/09/2021   Iron deficiency anemia 01/09/2021   Osteopenia 01/09/2021  Mitral valve prolapse 01/09/2021   Intertrigo 01/09/2021   Hardening of the aorta (main artery of the heart) (HCC) 01/09/2021   Hyperlipidemia 01/09/2021   Hypertension 01/09/2021   Unilateral primary osteoarthritis, right knee 08/17/2018   Chronic pain of right knee 08/25/2017   Symptomatic cholelithiasis 08/06/2015   Artificial knee joint present 06/11/2015   MITRAL VALVE PROLAPSE 11/16/2007   HIATAL HERNIA 11/16/2007   COUGH 11/16/2007   THYROID CANCER, HX OF 11/16/2007   Past Medical History:  Diagnosis Date   Anemia    Anxiety    uses prozac   Arthritis    "both knees" (08/06/2015)   Cervical spondylosis    Essential hypertension    Gout    Heart murmur    mitral valve prolapse with murmur   Hiatal hernia    History of mitral valve prolapse    Hypertension    Hypothyroidism    Major depression     Spinal headache 1970s   spinal headaches when they removed rectal polyps; "they had done a spinal tap"   Thyroid cancer (HCC) 03/2007    Family History  Problem Relation Age of Onset   Heart disease Father    Heart attack Father    Heart disease Mother    Heart attack Mother    Ovarian cancer Sister    Heart disease Paternal Uncle        CABG    Past Surgical History:  Procedure Laterality Date   ABDOMINAL HYSTERECTOMY  ~ 1975   APPENDECTOMY  1950   CARDIAC CATHETERIZATION  03/08/2003   Dr. Sharyn Lull   CHOLECYSTECTOMY N/A 08/07/2015   Procedure: LAPAROSCOPIC CHOLECYSTECTOMY;  Surgeon: Jimmye Norman, MD;  Location: [facility] OR;  Service: General;  Laterality: N/A;   DILATION AND CURETTAGE OF UTERUS  multiple   JOINT REPLACEMENT     ORIF ANKLE FRACTURE Left 10/16/2017   Procedure: OPEN REDUCTION INTERNAL FIXATION (ORIF) ANKLE FRACTURE;  Surgeon: Terance Hart, MD;  Location: [facility] OR;  Service: Orthopedics;  Laterality: Left;   TONSILLECTOMY  1950s   TOTAL KNEE ARTHROPLASTY Left 06/11/2015   Procedure: LEFT TOTAL KNEE ARTHROPLASTY;  Surgeon: Valeria Batman, MD;  Location: [facility] OR;  Service: Orthopedics;  Laterality: Left;   TOTAL THYROIDECTOMY  03/2007   Radioactive iodine therapy Dr. Talmage Nap   Social History   Occupational History   Not on file  Tobacco Use   Smoking status: Never   Smokeless tobacco: Never  Vaping Use   Vaping status: Never Used  Substance and Sexual Activity   Alcohol use: No    Alcohol/week: 0.0 standard drinks of alcohol   Drug use: No   Sexual activity: Never

## 2022-12-23 ENCOUNTER — Ambulatory Visit
Admission: RE | Admit: 2022-12-23 | Discharge: 2022-12-23 | Disposition: A | Discharge status: Home / Self Care | Payer: Medicare Other | Source: Ambulatory Visit | Attending: Obstetrics and Gynecology | Admitting: Obstetrics and Gynecology

## 2022-12-23 DIAGNOSIS — R928 Other abnormal and inconclusive findings on diagnostic imaging of breast: Secondary | ICD-10-CM

## 2023-06-03 ENCOUNTER — Ambulatory Visit: Admitting: Orthopaedic Surgery

## 2023-06-03 ENCOUNTER — Encounter: Payer: Self-pay | Admitting: Orthopaedic Surgery

## 2023-06-03 VITALS — Ht 65.0 in | Wt 187.0 lb

## 2023-06-03 DIAGNOSIS — M25561 Pain in right knee: Secondary | ICD-10-CM

## 2023-06-03 DIAGNOSIS — G8929 Other chronic pain: Secondary | ICD-10-CM

## 2023-06-03 NOTE — Progress Notes (Signed)
 PROCEDURE NOTE:  The patient requests injections of the right knee , verbal consent was obtained.  The right knee was prepped appropriately after time out was performed.   Sterile technique was observed and injection of 1 cc of DepoMedrol 40mg  with several cc's of plain xylocaine . Anesthesia was provided by ethyl chloride and a 20-gauge needle was used to inject the knee area. The injection was tolerated well.  A band aid dressing was applied.  The patient was advised to apply ice later today and tomorrow to the injection sight as needed.  Encounter Diagnosis  Name Primary?   Chronic pain of right knee Yes   I will see her in one month.  If she continues to have pain, I will give viscosupplementation to the right knee.  She will call before appointment to make sure we have the medicine.  If she is improved, call and cancel.  Call if any problem.  Precautions discussed.  Electronically Signed Pleasant Brilliant, MD 5/29/202510:57 AM

## 2023-06-09 ENCOUNTER — Telehealth: Payer: Self-pay | Admitting: Radiology

## 2023-06-09 MED ORDER — LIDOCAINE 5 % EX PTCH: 1.0000 | MEDICATED_PATCH | CUTANEOUS | 2 refills | Status: AC

## 2023-06-09 NOTE — Addendum Note (Signed)
 Addended by: Milinda Allen on: 06/09/2023 11:19 AM   Modules accepted: Orders

## 2023-06-09 NOTE — Telephone Encounter (Signed)
 Please see message from [place] office below and advise.   She called and is asking for a rx of Lidocaine  5% be sent in to [facility] in [facility] please.

## 2023-08-27 ENCOUNTER — Encounter: Payer: Self-pay | Admitting: Radiology

## 2023-11-08 ENCOUNTER — Encounter: Payer: Self-pay | Admitting: Radiology
